# Patient Record
Sex: Female | Born: 2000 | Race: White | Hispanic: No | Marital: Single | Smoking: Never smoker
Health system: Southern US, Community
[De-identification: ages and names within clinical notes are randomized; demographics above are authoritative.]

## PROBLEM LIST (undated history)

## (undated) DIAGNOSIS — R1013 Epigastric pain: Secondary | ICD-10-CM

## (undated) DIAGNOSIS — F32A Depression, unspecified: Secondary | ICD-10-CM

## (undated) DIAGNOSIS — F419 Anxiety disorder, unspecified: Secondary | ICD-10-CM

## (undated) DIAGNOSIS — K219 Gastro-esophageal reflux disease without esophagitis: Secondary | ICD-10-CM

## (undated) DIAGNOSIS — K5900 Constipation, unspecified: Secondary | ICD-10-CM

## (undated) DIAGNOSIS — K589 Irritable bowel syndrome without diarrhea: Secondary | ICD-10-CM

## (undated) DIAGNOSIS — R109 Unspecified abdominal pain: Secondary | ICD-10-CM

## (undated) DIAGNOSIS — IMO0001 Reserved for inherently not codable concepts without codable children: Secondary | ICD-10-CM

## (undated) HISTORY — DX: Reserved for inherently not codable concepts without codable children: IMO0001

## (undated) HISTORY — DX: Unspecified abdominal pain: R10.9

## (undated) HISTORY — DX: Anxiety disorder, unspecified: F41.9

## (undated) HISTORY — DX: Irritable bowel syndrome, unspecified: K58.9

## (undated) HISTORY — PX: WISDOM TOOTH EXTRACTION: SHX21

## (undated) HISTORY — DX: Constipation, unspecified: K59.00

## (undated) HISTORY — DX: Gastro-esophageal reflux disease without esophagitis: K21.9

---
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
## 2021-01-27 ENCOUNTER — Ambulatory Visit: Payer: BC Managed Care – PPO | Admitting: Internal Medicine

## 2021-01-27 ENCOUNTER — Other Ambulatory Visit: Payer: Self-pay

## 2021-01-27 ENCOUNTER — Ambulatory Visit (INDEPENDENT_AMBULATORY_CARE_PROVIDER_SITE_OTHER): Payer: 59 | Admitting: Internal Medicine

## 2021-01-27 ENCOUNTER — Encounter: Payer: Self-pay | Admitting: Internal Medicine

## 2021-01-27 DIAGNOSIS — K581 Irritable bowel syndrome with constipation: Secondary | ICD-10-CM | POA: Diagnosis not present

## 2021-01-27 DIAGNOSIS — F419 Anxiety disorder, unspecified: Secondary | ICD-10-CM | POA: Diagnosis not present

## 2021-01-27 DIAGNOSIS — K5909 Other constipation: Secondary | ICD-10-CM

## 2021-01-27 NOTE — Progress Notes (Signed)
Patient ID: Diana [name], female   DOB: 08/18/2000, 20 y.o.   MRN: [id number]   Subjective:    Patient ID: Diana [name], female    DOB: January 09, 2001, 20 y.o.   MRN: [id number]  This visit occurred during the SARS-CoV-2 public health emergency.  Safety protocols were in place, including screening questions prior to the visit, additional usage of staff PPE, and extensive cleaning of exam room while observing appropriate contact time as indicated for disinfecting solutions.   Patient here to establish care.   Chief Complaint  Patient presents with   Establish Care   .   HPI Previously followed by pediatrics.  Here to establish care. She is [name] relatively well.  Freshman at [employer].  States - up to date with immunizations.  Has been healthy.  Saw GI [facility]).  Was evaluated for chronic abdominal pain and alternating constipation and diarrhea.  Reports occasional nausea.  She follows a gluten free diet.  Avoid dairy.  Symptoms felt to be c/w IBS - constipation predominant.  Prescribed Linzess.  Has had issues with anxiety and depression.  Has previously seen a counselor - Oscar La.  On wellbutrin.  [name] well with this medication.  Breathing stable.  No increased cough or congestion.     Past Medical History:  Diagnosis Date   Abdominal pain    Anxiety    Phreesia 10/17/2019   Constipation    IBS (irritable bowel syndrome)    Reflux    Viral meningitis 2002/01/30   Past Surgical History:  Procedure Laterality Date   WISDOM TOOTH EXTRACTION     Family History  Problem Relation Age of Onset   Arthritis Brother    Asthma Brother    Rheum arthritis Brother    Endometrial cancer Mother    Anxiety disorder Mother    Hearing loss Father    Hypertension Father    Kidney Stones Father    Breast cancer Maternal Grandmother    Alzheimer's disease Maternal Grandmother    Hypertension Maternal Grandmother    Early death Maternal Grandfather    Macular degeneration  Paternal Grandmother    Hypertension Paternal Grandmother    Arthritis Paternal Grandmother    Kidney Stones Paternal Grandmother    Heart disease Paternal Grandfather    Kidney disease Paternal Grandfather    Hirschsprung's disease Neg Hx    GER disease Neg Hx    Social History   Socioeconomic History   Marital status: Single    Spouse name: Not on file   Number of children: Not on file   Years of education: Not on file   Highest education level: Not on file  Occupational History   Not on file  Tobacco Use   Smoking status: Never   Smokeless tobacco: Never  Substance and Sexual Activity   Alcohol use: Never   Drug use: Never   Sexual activity: Never  Other Topics Concern   Not on file  Social History Narrative   Lives with with mom, dad, and her brother Ree Kida   She will start 12th grade in the fall at [employer].    Social Determinants of Health   Financial Resource Strain: Not on file  Food Insecurity: Not on file  Transportation Needs: Not on file  Physical Activity: Not on file  Stress: Not on file  Social Connections: Not on file     Review of Systems  Constitutional:  Negative for appetite change and unexpected weight change.  HENT:  Negative for congestion and sinus pressure.   Respiratory:  Negative for cough, chest tightness and shortness of breath.   Cardiovascular:  Negative for chest pain, palpitations and leg swelling.  Gastrointestinal:  Negative for vomiting.       Some nausea and bowel change as outlined.    Genitourinary:  Negative for difficulty urinating and dysuria.  Musculoskeletal:  Negative for joint swelling and myalgias.  Skin:  Negative for color change and rash.  Neurological:  Negative for dizziness, light-headedness and headaches.  Psychiatric/Behavioral:  Negative for agitation and dysphoric mood.       Objective:     BP 116/70   Pulse 88   Temp 97.8 F (36.6 C)   Resp 16   Ht 5\' 4"  (1.626 m)   Wt 192 lb  3.2 oz (87.2 kg)   SpO2 99%   BMI 32.99 kg/m  Wt Readings from Last 3 Encounters:  01/27/21 192 lb 3.2 oz (87.2 kg) (97 %, Z= 1.83)*  10/19/19 202 lb 6.4 oz (91.8 kg) (98 %, Z= 2.01)*  04/13/19 205 lb (93 kg) (98 %, Z= 2.06)*   * Growth percentiles are based on CDC (Girls, 2-20 Years) data.    Physical Exam Vitals reviewed.  Constitutional:      General: She is not in acute distress.    Appearance: Normal appearance.  HENT:     Head: Normocephalic and atraumatic.     Right Ear: External ear normal.     Left Ear: External ear normal.  Eyes:     General: No scleral icterus.       Right eye: No discharge.        Left eye: No discharge.     Conjunctiva/sclera: Conjunctivae normal.  Neck:     Thyroid: No thyromegaly.  Cardiovascular:     Rate and Rhythm: Normal rate and regular rhythm.  Pulmonary:     Effort: No respiratory distress.     Breath sounds: Normal breath sounds. No wheezing.  Abdominal:     General: Bowel sounds are normal.     Palpations: Abdomen is soft.     Tenderness: There is no abdominal tenderness.  Musculoskeletal:        General: No swelling or tenderness.     Cervical back: Neck supple. No tenderness.  Lymphadenopathy:     Cervical: No cervical adenopathy.  Skin:    Findings: No erythema or rash.  Neurological:     Mental Status: She is alert.  Psychiatric:        Mood and Affect: Mood normal.        Behavior: Behavior normal.     Outpatient Encounter Medications as of 01/27/2021  Medication Sig   buPROPion (WELLBUTRIN XL) 300 MG 24 hr tablet Take 300 mg by mouth daily.   linaclotide (LINZESS) 72 MCG capsule Take by mouth.   Biotin 5000 MCG TABS Take by mouth.   Calcium Polycarbophil (FIBER) 625 MG TABS Take by mouth.   Cholecalciferol (VITAMIN D3) 25 MCG (1000 UT) CAPS Take by mouth.   [DISCONTINUED] acetaminophen (TYLENOL) 500 MG tablet Take 500-1,000 mg by mouth every 6 (six) hours as needed for mild pain or fever. (Patient not taking:  Reported on 10/19/2019)   [DISCONTINUED] albuterol (VENTOLIN HFA) 108 (90 Base) MCG/ACT inhaler Inhale 2 puffs into the lungs every 6 (six) hours as needed for wheezing or shortness of breath. (Patient not taking: Reported on 10/19/2019)   [DISCONTINUED] buPROPion (WELLBUTRIN XL) 150 MG 24 hr tablet Take  300 mg by mouth daily.    [DISCONTINUED] ondansetron (ZOFRAN ODT) 4 MG disintegrating tablet Take 1 tablet (4 mg total) by mouth every 8 (eight) hours as needed for nausea or vomiting. (Patient not taking: Reported on 10/19/2019)   [DISCONTINUED] Spacer/Aero-Hold Chamber Bags (INSPIREASE RESERVOIR BAGS) MISC Use the inspiratory use or other spacer with the inhaler. (Patient not taking: Reported on 10/19/2019)   No facility-administered encounter medications on file as of 01/27/2021.     Lab Results  Component Value Date   WBC 3.1 (L) 04/13/2019   HGB 14.0 04/13/2019   HCT 40.3 04/13/2019   PLT 223 04/13/2019   GLUCOSE 116 (H) 04/13/2019   ALT 14 04/13/2019   AST 17 04/13/2019   NA 139 04/13/2019   K 3.9 04/13/2019   CL 104 04/13/2019   CREATININE 0.77 04/13/2019   BUN 8 04/13/2019   CO2 25 04/13/2019    DG Chest Portable 1 View  Result Date: 04/13/2019 CLINICAL DATA:  20 year old COVID-19 positive patient with fatigue, cough, chest congestion and shortness of breath. EXAM: PORTABLE CHEST 1 VIEW COMPARISON:  None. FINDINGS: Cardiac silhouette and mediastinal contours normal in appearance for the AP portable technique. Pulmonary parenchyma clear. Bronchovascular markings normal. Pulmonary vascularity normal. No pneumothorax. No visible pleural effusions. IMPRESSION: No acute cardiopulmonary disease. Electronically Signed   By: Hulan Saas M.D.   On: 04/13/2019 11:27       Assessment & Plan:   Problem List Items Addressed This Visit     Anxiety    Previously saw a counselor as outlined.  On wellbutrin.  Stable.  Follow.        Relevant Medications   buPROPion (WELLBUTRIN XL) 300  MG 24 hr tablet   Chronic constipation    Saw GI as outlined.  Linzess prescribed.  Follow.       IBS (irritable bowel syndrome)    Recently saw GI.  Symptoms felt to be c/w IBS - constipation predominant.  Prescribed Linzess.  Just started.  Follow symptoms.  Continue f/u with GI.        Relevant Medications   linaclotide (LINZESS) 72 MCG capsule     Dale [place], MD

## 2021-02-01 ENCOUNTER — Encounter: Payer: Self-pay | Admitting: Internal Medicine

## 2021-02-01 DIAGNOSIS — K589 Irritable bowel syndrome without diarrhea: Secondary | ICD-10-CM | POA: Insufficient documentation

## 2021-02-01 DIAGNOSIS — F419 Anxiety disorder, unspecified: Secondary | ICD-10-CM | POA: Insufficient documentation

## 2021-02-01 NOTE — Assessment & Plan Note (Signed)
Previously saw a Veterinary surgeon as outlined.  On wellbutrin.  Stable.  Follow.

## 2021-02-01 NOTE — Assessment & Plan Note (Signed)
Recently saw GI.  Symptoms felt to be c/w IBS - constipation predominant.  Prescribed Linzess.  Just started.  Follow symptoms.  Continue f/u with GI.

## 2021-02-01 NOTE — Assessment & Plan Note (Signed)
Saw GI as outlined.  Linzess prescribed.  Follow.

## 2021-04-21 DIAGNOSIS — D2262 Melanocytic nevi of left upper limb, including shoulder: Secondary | ICD-10-CM | POA: Diagnosis not present

## 2021-04-21 DIAGNOSIS — D2272 Melanocytic nevi of left lower limb, including hip: Secondary | ICD-10-CM | POA: Diagnosis not present

## 2021-04-21 DIAGNOSIS — D2271 Melanocytic nevi of right lower limb, including hip: Secondary | ICD-10-CM | POA: Diagnosis not present

## 2021-04-21 DIAGNOSIS — D2261 Melanocytic nevi of right upper limb, including shoulder: Secondary | ICD-10-CM | POA: Diagnosis not present

## 2021-04-21 DIAGNOSIS — D225 Melanocytic nevi of trunk: Secondary | ICD-10-CM | POA: Diagnosis not present

## 2021-04-21 DIAGNOSIS — L2089 Other atopic dermatitis: Secondary | ICD-10-CM | POA: Diagnosis not present

## 2021-04-30 IMAGING — DX DG CHEST 1V PORT
1 series · 1 of 1 positions shown · non-contrast
Comparison: None.

CLINICAL DATA: 18-year-old MASTZ-HV positive patient with fatigue,
cough, chest congestion and shortness of breath.

EXAM:
PORTABLE CHEST 1 VIEW

[chest ap]
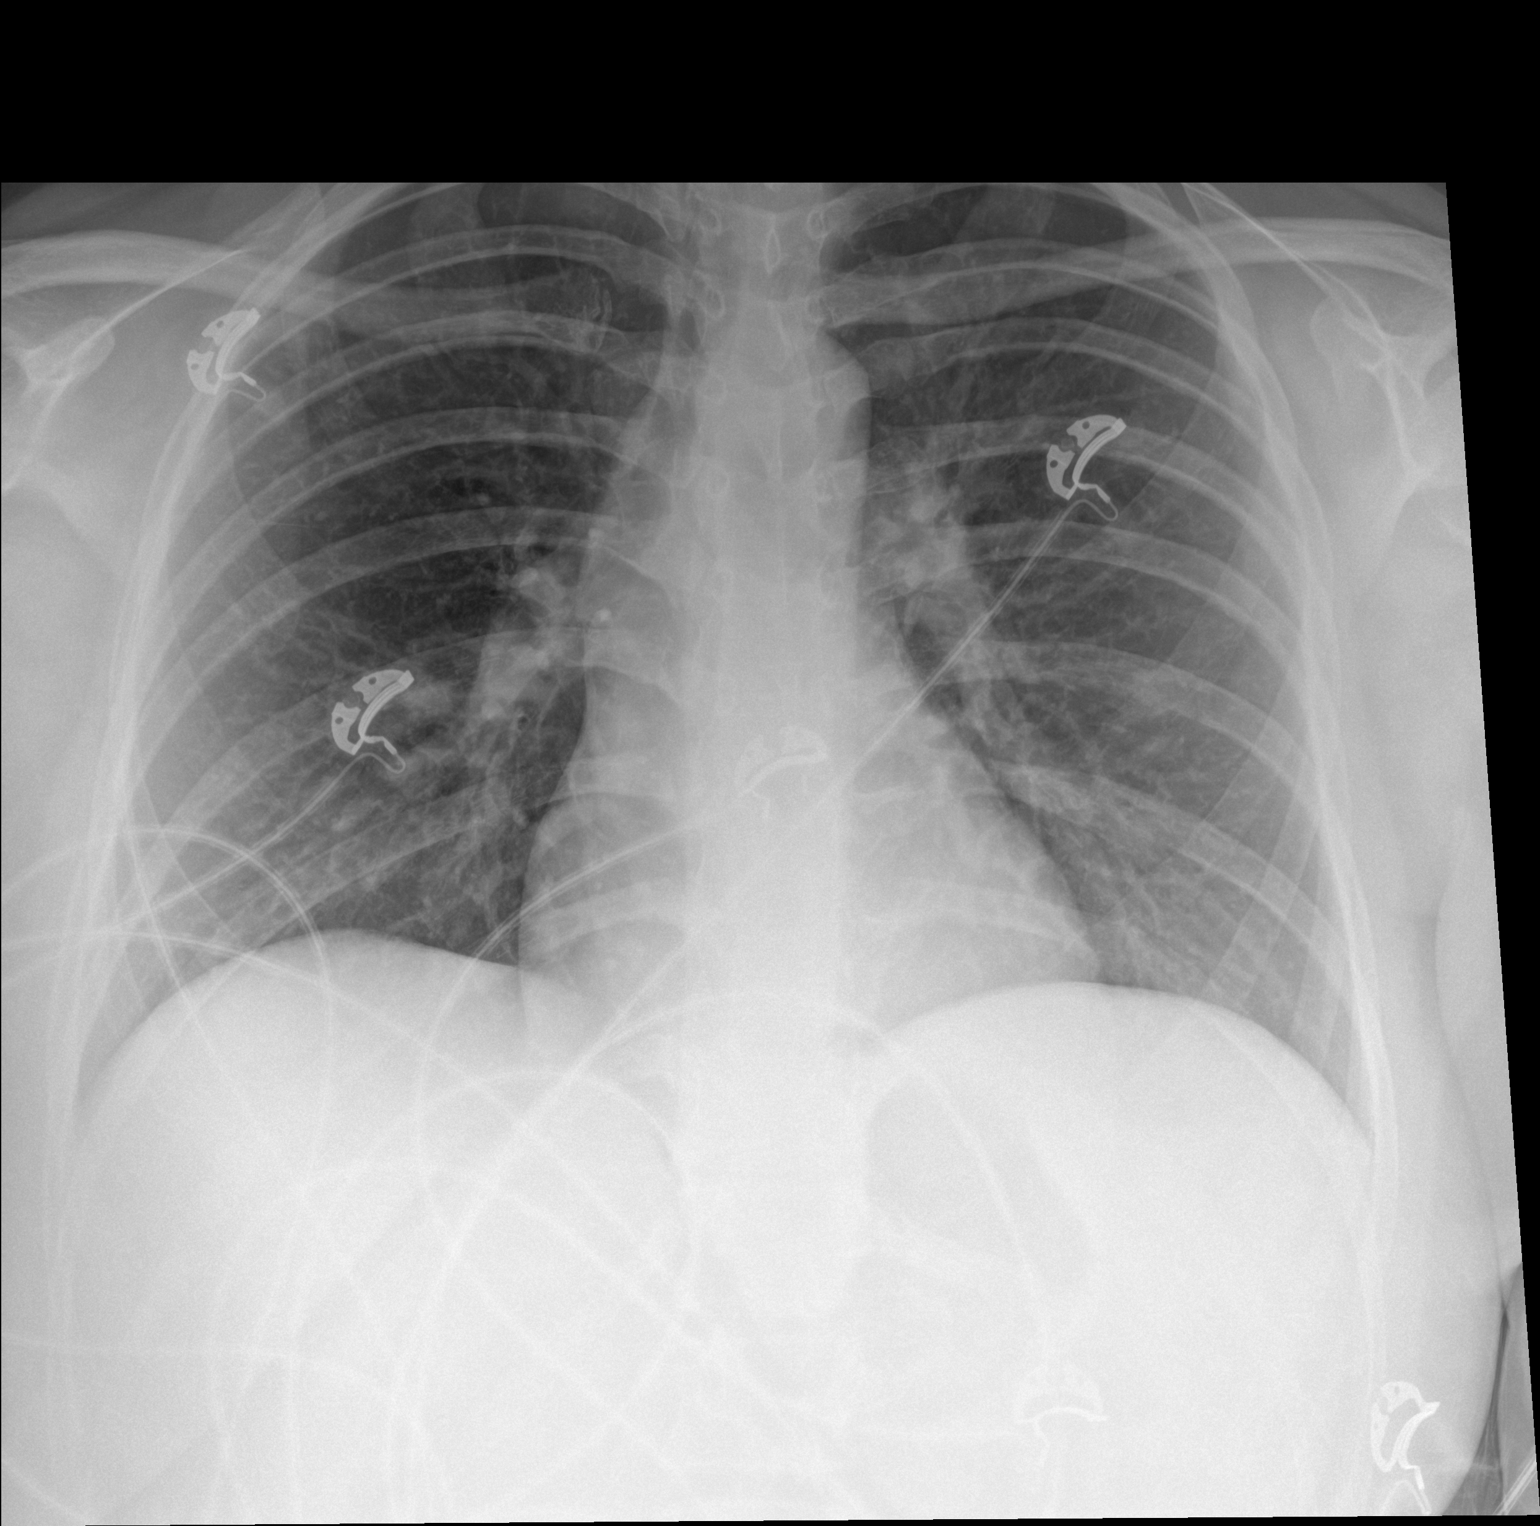

[1 of 1 positions shown; findings below may reference images not displayed]

FINDINGS: Cardiac silhouette and mediastinal contours normal in appearance for
the AP portable technique. Pulmonary parenchyma clear.
Bronchovascular markings normal. Pulmonary vascularity normal. No
pneumothorax. No visible pleural effusions.
IMPRESSION: No acute cardiopulmonary disease.

## 2021-05-30 ENCOUNTER — Other Ambulatory Visit: Payer: Self-pay

## 2021-05-30 ENCOUNTER — Encounter: Payer: Self-pay | Admitting: Internal Medicine

## 2021-05-30 ENCOUNTER — Ambulatory Visit (INDEPENDENT_AMBULATORY_CARE_PROVIDER_SITE_OTHER): Payer: 59 | Admitting: Internal Medicine

## 2021-05-30 DIAGNOSIS — K581 Irritable bowel syndrome with constipation: Secondary | ICD-10-CM

## 2021-05-30 DIAGNOSIS — K5909 Other constipation: Secondary | ICD-10-CM

## 2021-05-30 DIAGNOSIS — F419 Anxiety disorder, unspecified: Secondary | ICD-10-CM

## 2021-05-30 MED ORDER — BUSPIRONE HCL 5 MG PO TABS: 5.0000 mg | ORAL_TABLET | Freq: Two times a day (BID) | ORAL | 1 refills | Status: DC

## 2021-05-30 NOTE — Progress Notes (Signed)
Subjective:    Patient ID: Diana [name], female    DOB: [birth year], 21 y.o.   MRN: [id number]  This visit occurred during the SARS-CoV-2 public health emergency.  Safety protocols were in place, including screening questions prior to the visit, additional usage of staff PPE, and extensive cleaning of exam room while observing appropriate contact time as indicated for disinfecting solutions.   Patient here for a scheduled follow up.   Chief Complaint  Patient presents with   Follow-up    4 month follow up   .   HPI Here to follow up regarding increased stress.  Reports increased stress at college/classes.  Had a busy week this past week.  Taking wellbutrin.  Does feel helps.  Feels needs something more to help with controlling increased anxiety.  Still with some bowel issues.  On Linzess.  Diagnosed with IBS-C.  Has f/u next month with GI.  No chest pain or sob reported.  No cough or congestion.  Does report when anxious, some nausea associated.  Eating breakfast - helps.     Past Medical History:  Diagnosis Date   Abdominal pain    Anxiety    Phreesia 10/17/2019   Constipation    IBS (irritable bowel syndrome)    Reflux    Viral meningitis 01/10/2002   Past Surgical History:  Procedure Laterality Date   WISDOM TOOTH EXTRACTION     Family History  Problem Relation Age of Onset   Arthritis Brother    Asthma Brother    Rheum arthritis Brother    Endometrial cancer Mother    Anxiety disorder Mother    Hearing loss Father    Hypertension Father    Kidney Stones Father    Breast cancer Maternal Grandmother    Alzheimer's disease Maternal Grandmother    Hypertension Maternal Grandmother    Early death Maternal Grandfather    Macular degeneration Paternal Grandmother    Hypertension Paternal Grandmother    Arthritis Paternal Grandmother    Kidney Stones Paternal Grandmother    Heart disease Paternal Grandfather    Kidney disease Paternal Grandfather    Hirschsprung's  disease Neg Hx    GER disease Neg Hx    Social History   Socioeconomic History   Marital status: Single    Spouse name: Not on file   Number of children: Not on file   Years of education: Not on file   Highest education level: Not on file  Occupational History   Not on file  Tobacco Use   Smoking status: Never   Smokeless tobacco: Never  Substance and Sexual Activity   Alcohol use: Never   Drug use: Never   Sexual activity: Never  Other Topics Concern   Not on file  Social History Narrative   Lives with with mom, dad, and her brother Ree Kida   She will start 12th grade in the fall at [employer].    Social Determinants of Health   Financial Resource Strain: Not on file  Food Insecurity: Not on file  Transportation Needs: Not on file  Physical Activity: Not on file  Stress: Not on file  Social Connections: Not on file     Review of Systems  Constitutional:  Negative for appetite change and unexpected weight change.  HENT:  Negative for congestion and sinus pressure.   Respiratory:  Negative for cough, chest tightness and shortness of breath.   Cardiovascular:  Negative for chest pain, palpitations and leg swelling.  Gastrointestinal:  Positive for constipation and nausea. Negative for abdominal pain, diarrhea and vomiting.  Genitourinary:  Negative for difficulty urinating and dysuria.  Musculoskeletal:  Negative for joint swelling and myalgias.  Skin:  Negative for color change and rash.  Neurological:  Negative for dizziness, light-headedness and headaches.  Psychiatric/Behavioral:  Negative for agitation and dysphoric mood.       Objective:     BP 110/78    Pulse 93    Temp 97.7 F (36.5 C) (Oral)    Resp 16    Ht 5\' 4"  (1.626 m)    Wt 204 lb 12.8 oz (92.9 kg)    SpO2 97%    BMI 35.15 kg/m  Wt Readings from Last 3 Encounters:  05/30/21 204 lb 12.8 oz (92.9 kg) (98 %, Z= 2.01)*  01/27/21 192 lb 3.2 oz (87.2 kg) (97 %, Z= 1.83)*  10/19/19 202  lb 6.4 oz (91.8 kg) (98 %, Z= 2.01)*   * Growth percentiles are based on CDC (Girls, 2-20 Years) data.    Physical Exam Vitals reviewed.  Constitutional:      General: She is not in acute distress.    Appearance: Normal appearance.  HENT:     Head: Normocephalic and atraumatic.     Right Ear: External ear normal.     Left Ear: External ear normal.  Eyes:     General: No scleral icterus.       Right eye: No discharge.        Left eye: No discharge.     Conjunctiva/sclera: Conjunctivae normal.  Neck:     Thyroid: No thyromegaly.  Cardiovascular:     Rate and Rhythm: Normal rate and regular rhythm.  Pulmonary:     Effort: No respiratory distress.     Breath sounds: Normal breath sounds. No wheezing.  Abdominal:     General: Bowel sounds are normal.     Palpations: Abdomen is soft.     Tenderness: There is no abdominal tenderness.  Musculoskeletal:        General: No swelling or tenderness.     Cervical back: Neck supple. No tenderness.  Lymphadenopathy:     Cervical: No cervical adenopathy.  Skin:    Findings: No erythema or rash.  Neurological:     Mental Status: She is alert.  Psychiatric:        Mood and Affect: Mood normal.        Behavior: Behavior normal.     Outpatient Encounter Medications as of 05/30/2021  Medication Sig   Biotin 5000 MCG TABS Take by mouth.   buPROPion (WELLBUTRIN XL) 300 MG 24 hr tablet Take 300 mg by mouth daily.   busPIRone (BUSPAR) 5 MG tablet Take 1 tablet (5 mg total) by mouth 2 (two) times daily.   Calcium Polycarbophil (FIBER) 625 MG TABS Take by mouth.   Cholecalciferol (VITAMIN D3) 25 MCG (1000 UT) CAPS Take by mouth.   linaclotide (LINZESS) 72 MCG capsule Take by mouth.   triamcinolone cream (KENALOG) 0.1 % Apply topically 2 (two) times daily as needed.   No facility-administered encounter medications on file as of 05/30/2021.     Lab Results  Component Value Date   WBC 3.1 (L) 04/13/2019   HGB 14.0 04/13/2019   HCT 40.3  04/13/2019   PLT 223 04/13/2019   GLUCOSE 116 (H) 04/13/2019   ALT 14 04/13/2019   AST 17 04/13/2019   NA 139 04/13/2019   K 3.9 04/13/2019   CL 104 04/13/2019  CREATININE 0.77 04/13/2019   BUN 8 04/13/2019   CO2 25 04/13/2019    DG Chest Portable 1 View  Result Date: 04/13/2019 CLINICAL DATA:  21 year old COVID-19 positive patient with fatigue, cough, chest congestion and shortness of breath. EXAM: PORTABLE CHEST 1 VIEW COMPARISON:  None. FINDINGS: Cardiac silhouette and mediastinal contours normal in appearance for the AP portable technique. Pulmonary parenchyma clear. Bronchovascular markings normal. Pulmonary vascularity normal. No pneumothorax. No visible pleural effusions. IMPRESSION: No acute cardiopulmonary disease. Electronically Signed   By: Hulan Saas M.D.   On: 04/13/2019 11:27       Assessment & Plan:   Problem List Items Addressed This Visit     Anxiety    Previously saw a counselor.  Does not feel needs to see counselor at this time.  On wellbutrin.  Feels needs something more to help control anxiety.  Discussed treatment options.  Start buspar 5mg  bid.  Follow.        Relevant Medications   busPIRone (BUSPAR) 5 MG tablet   Chronic constipation    Saw GI.  On Linzess.  Has f/u with GI next month.       IBS (irritable bowel syndrome)    Saw GI.  Symptoms felt to be c/w IBS - constipation predominant.  On Linzess.  Continue f/u with GI.  Has appt next month.  Treat anxiety.         , MD

## 2021-05-31 ENCOUNTER — Encounter: Payer: Self-pay | Admitting: Internal Medicine

## 2021-05-31 NOTE — Assessment & Plan Note (Signed)
Saw GI.  On Linzess.  Has f/u with GI next month.

## 2021-05-31 NOTE — Assessment & Plan Note (Signed)
Saw GI.  Symptoms felt to be c/w IBS - constipation predominant.  On Linzess.  Continue f/u with GI.  Has appt next month.  Treat anxiety.

## 2021-05-31 NOTE — Assessment & Plan Note (Signed)
Previously saw a Veterinary surgeon.  Does not feel needs to see counselor at this time.  On wellbutrin.  Feels needs something more to help control anxiety.  Discussed treatment options.  Start buspar 5mg  bid.  Follow.

## 2021-07-03 DIAGNOSIS — K582 Mixed irritable bowel syndrome: Secondary | ICD-10-CM | POA: Diagnosis not present

## 2021-07-03 DIAGNOSIS — K219 Gastro-esophageal reflux disease without esophagitis: Secondary | ICD-10-CM | POA: Diagnosis not present

## 2021-07-03 DIAGNOSIS — K5909 Other constipation: Secondary | ICD-10-CM | POA: Diagnosis not present

## 2021-07-14 ENCOUNTER — Telehealth: Payer: Self-pay | Admitting: Internal Medicine

## 2021-07-14 NOTE — Telephone Encounter (Signed)
Pt mom called in stating pt need refill on buPROPion sent to [facility] in [place] ?

## 2021-07-15 ENCOUNTER — Other Ambulatory Visit: Payer: Self-pay

## 2021-07-15 MED ORDER — BUPROPION HCL ER (XL) 300 MG PO TB24: 300.0000 mg | ORAL_TABLET | Freq: Every day | ORAL | 2 refills | Status: DC

## 2021-07-15 NOTE — Telephone Encounter (Signed)
sent 

## 2021-07-28 ENCOUNTER — Other Ambulatory Visit: Payer: Self-pay | Admitting: Internal Medicine

## 2021-08-01 ENCOUNTER — Ambulatory Visit (INDEPENDENT_AMBULATORY_CARE_PROVIDER_SITE_OTHER): Payer: 59 | Admitting: Internal Medicine

## 2021-08-01 ENCOUNTER — Encounter: Payer: Self-pay | Admitting: Internal Medicine

## 2021-08-01 DIAGNOSIS — K581 Irritable bowel syndrome with constipation: Secondary | ICD-10-CM

## 2021-08-01 DIAGNOSIS — F419 Anxiety disorder, unspecified: Secondary | ICD-10-CM

## 2021-08-01 DIAGNOSIS — R0981 Nasal congestion: Secondary | ICD-10-CM

## 2021-08-01 DIAGNOSIS — K5909 Other constipation: Secondary | ICD-10-CM

## 2021-08-01 DIAGNOSIS — R69 Illness, unspecified: Secondary | ICD-10-CM | POA: Diagnosis not present

## 2021-08-01 MED ORDER — MAGNESIUM GLYCINATE 100 MG PO CAPS: ORAL_CAPSULE | ORAL | 1 refills | Status: DC

## 2021-08-01 NOTE — Progress Notes (Signed)
Patient ID: Diana [name], female   DOB: [birth year], 21 y.o.   MRN: [id number] ? ? ?Subjective:  ? ? Patient ID: Diana DoingMeleah Sottile, female    DOB: [birth year], 21 y.o.   MRN: [id number] ? ?This visit occurred during the SARS-CoV-2 public health emergency.  Safety protocols were in place, including screening questions prior to the visit, additional usage of staff PPE, and extensive cleaning of exam room while observing appropriate contact time as indicated for disinfecting solutions.  ? ?Patient here for a scheduled follow up.  ? ?Chief Complaint  ?Patient presents with  ? Follow-up  ?  2 month follow up   ? .  ? ?HPI ?She is accompanied by her mother.  History obgtained from both of them. Here to follow up regarding increased stress and anxiety.  Also persistent GI issues.  Starting last week - sore throat and increased drainage.  Some increased congestion and cough.  Is feeling better, but still some congestion and drainage.  No chest pain or sob reported.  Increased stress and anxiety.  On wellbutrin.  Previously on zoloft, but gained weight on zoloft.  Taking buspar as well.  Feels needs something different.  Increased stress related to her medical issues - persistent problems with her bowels.  Increased constipation.  Some nausea associated.  Linzess worked well.  Insurance does not cover.  On amitiza now.  Does not help and does not tolerate.  Taking miralax.  Discussed the need to for f/u with GI.  Also with headaches. Occur several times per week.  Will occasionally take excedrin.  Headache may last 10 minutes if takes excedrin and 30 minutes if does not take anything.  Tries not to take excedrin regularly.  No suicidal ideations.   ? ? ?Past Medical History:  ?Diagnosis Date  ? Abdominal pain   ? Anxiety   ? Phreesia 10/17/2019  ? Constipation   ? IBS (irritable bowel syndrome)   ? Reflux   ? Viral meningitis 11/17/2001  ? ?Past Surgical History:  ?Procedure Laterality Date  ? WISDOM TOOTH EXTRACTION    ? ?Family  History  ?Problem Relation Age of Onset  ? Arthritis Brother   ? Asthma Brother   ? Rheum arthritis Brother   ? Endometrial cancer Mother   ? Anxiety disorder Mother   ? Hearing loss Father   ? Hypertension Father   ? Kidney Stones Father   ? Breast cancer Maternal Grandmother   ? Alzheimer's disease Maternal Grandmother   ? Hypertension Maternal Grandmother   ? Early death Maternal Grandfather   ? Macular degeneration Paternal Grandmother   ? Hypertension Paternal Grandmother   ? Arthritis Paternal Grandmother   ? Kidney Stones Paternal Grandmother   ? Heart disease Paternal Grandfather   ? Kidney disease Paternal Grandfather   ? Hirschsprung's disease Neg Hx   ? GER disease Neg Hx   ? ?Social History  ? ?Socioeconomic History  ? Marital status: Single  ?  Spouse name: Not on file  ? Number of children: Not on file  ? Years of education: Not on file  ? Highest education level: Not on file  ?Occupational History  ? Not on file  ?Tobacco Use  ? Smoking status: Never  ? Smokeless tobacco: Never  ?Substance and Sexual Activity  ? Alcohol use: Never  ? Drug use: Never  ? Sexual activity: Never  ?Other Topics Concern  ? Not on file  ?Social History Narrative  ? Lives with with mom, dad,  and her brother Ree Kida  ? She will start 12th grade in the fall at [employer].   ? ?Social Determinants of Health  ? ?Financial Resource Strain: Not on file  ?Food Insecurity: Not on file  ?Transportation Needs: Not on file  ?Physical Activity: Not on file  ?Stress: Not on file  ?Social Connections: Not on file  ? ? ? ?Review of Systems  ?Constitutional:  Negative for appetite change, fever and unexpected weight change.  ?HENT:  Positive for congestion and postnasal drip.   ?Respiratory:  Negative for chest tightness and shortness of breath.   ?     Some cough.   ?Cardiovascular:  Negative for chest pain, palpitations and leg swelling.  ?Gastrointestinal:  Positive for abdominal pain, constipation and nausea. Negative for  vomiting.  ?Genitourinary:  Negative for difficulty urinating and dysuria.  ?Musculoskeletal:  Negative for joint swelling and myalgias.  ?Skin:  Negative for color change and rash.  ?Neurological:  Positive for dizziness and headaches.  ?     Intermittent dizziness as outlined.   ?Psychiatric/Behavioral:  Negative for suicidal ideas.   ?     Increased stress and anxiety as outlined. Anxiety and stress worsened with current medical issues - worsening GI issues.  No suicidal ideations.   ? ?   ?Objective:  ?  ? ?BP 104/78 (BP Location: Left Arm, Patient Position: Sitting, Cuff Size: Large)   Pulse 79   Temp (!) 97 ?F (36.1 ?C) (Oral)   Ht 5\' 4"  (1.626 m)   Wt 197 lb 9.6 oz (89.6 kg)   SpO2 97%   BMI 33.92 kg/m?  ?Wt Readings from Last 3 Encounters:  ?08/01/21 197 lb 9.6 oz (89.6 kg) (97 %, Z= 1.91)*  ?05/30/21 204 lb 12.8 oz (92.9 kg) (98 %, Z= 2.01)*  ?01/27/21 192 lb 3.2 oz (87.2 kg) (97 %, Z= 1.83)*  ? ?* Growth percentiles are based on CDC (Girls, 2-20 Years) data.  ? ? ?Physical Exam ?Vitals reviewed.  ?Constitutional:   ?   General: She is not in acute distress. ?   Appearance: Normal appearance.  ?HENT:  ?   Head: Normocephalic and atraumatic.  ?   Right Ear: External ear normal.  ?   Left Ear: External ear normal.  ?Eyes:  ?   General: No scleral icterus.    ?   Right eye: No discharge.     ?   Left eye: No discharge.  ?   Conjunctiva/sclera: Conjunctivae normal.  ?Neck:  ?   Thyroid: No thyromegaly.  ?Cardiovascular:  ?   Rate and Rhythm: Normal rate and regular rhythm.  ?Pulmonary:  ?   Effort: No respiratory distress.  ?   Breath sounds: Normal breath sounds. No wheezing.  ?Abdominal:  ?   General: Bowel sounds are normal.  ?   Palpations: Abdomen is soft.  ?   Tenderness: There is no abdominal tenderness.  ?Musculoskeletal:     ?   General: No swelling or tenderness.  ?   Cervical back: Neck supple. No tenderness.  ?Lymphadenopathy:  ?   Cervical: No cervical adenopathy.  ?Skin: ?   Findings: No  erythema or rash.  ?Neurological:  ?   Mental Status: She is alert.  ?Psychiatric:     ?   Mood and Affect: Mood normal.     ?   Behavior: Behavior normal.  ? ? ? ?Outpatient Encounter Medications as of 08/01/2021  ?Medication Sig  ? Biotin 5000 MCG  TABS Take by mouth.  ? buPROPion (WELLBUTRIN XL) 300 MG 24 hr tablet Take 1 tablet (300 mg total) by mouth daily.  ? busPIRone (BUSPAR) 5 MG tablet TAKE 1 TABLET BY MOUTH TWICE A DAY  ? Calcium Polycarbophil (FIBER) 625 MG TABS Take by mouth.  ? Cholecalciferol (VITAMIN D3) 25 MCG (1000 UT) CAPS Take by mouth.  ? lubiprostone (AMITIZA) 8 MCG capsule Take 8 mcg by mouth 2 (two) times daily.  ? Magnesium Glycinate 100 MG CAPS On capsule q day  ? triamcinolone cream (KENALOG) 0.1 % Apply topically 2 (two) times daily as needed.  ? [DISCONTINUED] linaclotide (LINZESS) 72 MCG capsule Take by mouth. (Patient not taking: Reported on 08/01/2021)  ? ?No facility-administered encounter medications on file as of 08/01/2021.  ?  ? ?Lab Results  ?Component Value Date  ? WBC 3.1 (L) 04/13/2019  ? HGB 14.0 04/13/2019  ? HCT 40.3 04/13/2019  ? PLT 223 04/13/2019  ? GLUCOSE 116 (H) 04/13/2019  ? ALT 14 04/13/2019  ? AST 17 04/13/2019  ? NA 139 04/13/2019  ? K 3.9 04/13/2019  ? CL 104 04/13/2019  ? CREATININE 0.77 04/13/2019  ? BUN 8 04/13/2019  ? CO2 25 04/13/2019  ? ? ?DG Chest Portable 1 View ? ?Result Date: 04/13/2019 ?CLINICAL DATA:  21 year old COVID-19 positive patient with fatigue, cough, chest congestion and shortness of breath. EXAM: PORTABLE CHEST 1 VIEW COMPARISON:  None. FINDINGS: Cardiac silhouette and mediastinal contours normal in appearance for the AP portable technique. Pulmonary parenchyma clear. Bronchovascular markings normal. Pulmonary vascularity normal. No pneumothorax. No visible pleural effusions. IMPRESSION: No acute cardiopulmonary disease. Electronically Signed   By: Hulan Saas M.D.   On: 04/13/2019 11:27  ? ? ?   ?Assessment & Plan:  ? ?Problem List Items  Addressed This Visit   ? ? Anxiety  ?  Increased stress and anxiety as outlined.  Worsened recently.  On wellbutrin.  Taking buspar as well.  Previously was on zoloft.  Increased weight gain.  Did not feel work

## 2021-08-03 ENCOUNTER — Encounter: Payer: Self-pay | Admitting: Internal Medicine

## 2021-08-03 DIAGNOSIS — R0981 Nasal congestion: Secondary | ICD-10-CM | POA: Insufficient documentation

## 2021-08-03 NOTE — Assessment & Plan Note (Signed)
Increased stress and anxiety as outlined.  Worsened recently.  On wellbutrin.  Taking buspar as well.  Previously was on zoloft.  Increased weight gain.  Did not feel worked any better than wellbutrin.  Discussed medication changes.  Discussed psychiatry referral.  She is agreeable.  Discussed with psychiatry regarding medication changes.  Follow.  No SI.   ?

## 2021-08-03 NOTE — Assessment & Plan Note (Signed)
Is followed by GI.  linzess worked well, but insurance would not cover.  On amitiza.  Did not tolerate and does not work.  Increased constipation and discomfort.  Discussed the need for f/u with GI. Plans to contact and f/u.  Discussed possible need for EGD/colonoscopy.  miralax daily.  Follow. ?

## 2021-08-03 NOTE — Assessment & Plan Note (Addendum)
Is followed by GI.  linzess worked well, but insurance would not cover.  On amitiza.  Did not tolerate and does not work.  Increased constipation and discomfort.  Discussed the need for f/u with GI. Plans to contact and f/u.  Discussed possible need for EGD/colonoscopy.  miralax daily.  Follow.  D/w pharmacy regarding any pt assistance for Linzess.  ?

## 2021-08-03 NOTE — Assessment & Plan Note (Signed)
Symptoms over one week.  Better.  Persistent nasal congestion and drainage.  Discussed continuing mucinex.  Saline nasal nasal spray.  Improved.  Follow. Notify me if symptoms do not resolve.  ?

## 2021-08-19 ENCOUNTER — Telehealth: Payer: Self-pay | Admitting: Internal Medicine

## 2021-08-19 DIAGNOSIS — F419 Anxiety disorder, unspecified: Secondary | ICD-10-CM

## 2021-08-19 NOTE — Telephone Encounter (Signed)
The patient's  mother wanted to inquire about the patient's referral to Dr. Nicolasa Ducking to adjust the patient's medication. She also is needing help with options to purchase Linzess and a lower cost.  ?

## 2021-08-20 NOTE — Telephone Encounter (Signed)
Notify - I have talked with Dr Maryruth Bun.  I have placed the order for the referral.  Also, she had mentioned possibly starting effexor.  Is she agreeable to start/add medication.   ?

## 2021-08-20 NOTE — Telephone Encounter (Signed)
Lm for Joy to cb ?

## 2021-08-27 NOTE — Telephone Encounter (Signed)
Gunnar Fusi from Dr Maryruth Bun office states she can not get in touch with pt to make an appt ?

## 2021-08-29 ENCOUNTER — Telehealth: Payer: Self-pay

## 2021-08-29 NOTE — Telephone Encounter (Signed)
Gunnar Fusi called from Dr. Armond Hang office.  Gunnar Fusi states that they have left at least six messages trying to get patient to schedule an appointment and they have not heard from patient. ?

## 2021-08-30 NOTE — Telephone Encounter (Signed)
Called and left message for Diana [name] (mother) to call back regarding Diana [name].  [name] had stated to contact mother with information.  Notify that I did talk to Dr Nicolasa Ducking and she was trying to get in touch to work her in and discuss changing medication.   ?

## 2021-09-02 ENCOUNTER — Other Ambulatory Visit: Payer: Self-pay | Admitting: Internal Medicine

## 2021-09-02 MED ORDER — LINACLOTIDE 145 MCG PO CAPS
145.0000 ug | ORAL_CAPSULE | Freq: Every day | ORAL | 1 refills | Status: DC
Start: 2021-09-02 — End: 2022-09-29

## 2021-09-02 NOTE — Progress Notes (Signed)
Discussed with pts mother.  GI had prescribed linzess q day. Not covered by her insurance.  Spoke with Wilkie Aye at [facility] pharmacy.  Rx sent to [facility].  Pt and mother aware.  ?

## 2021-09-02 NOTE — Telephone Encounter (Signed)
Patient's mother called to let Dr Nicki Reaper know that an appointment has been made with Dr Nicolasa Ducking. It is sometime in June, mother did not have calender to look up the date and time. ?

## 2021-09-11 DIAGNOSIS — K582 Mixed irritable bowel syndrome: Secondary | ICD-10-CM | POA: Diagnosis not present

## 2021-09-11 DIAGNOSIS — K219 Gastro-esophageal reflux disease without esophagitis: Secondary | ICD-10-CM | POA: Diagnosis not present

## 2021-09-11 DIAGNOSIS — K5909 Other constipation: Secondary | ICD-10-CM | POA: Diagnosis not present

## 2021-09-26 ENCOUNTER — Ambulatory Visit (INDEPENDENT_AMBULATORY_CARE_PROVIDER_SITE_OTHER): Payer: 59 | Admitting: Internal Medicine

## 2021-09-26 ENCOUNTER — Encounter: Payer: Self-pay | Admitting: Internal Medicine

## 2021-09-26 DIAGNOSIS — K5909 Other constipation: Secondary | ICD-10-CM

## 2021-09-26 DIAGNOSIS — F419 Anxiety disorder, unspecified: Secondary | ICD-10-CM | POA: Diagnosis not present

## 2021-09-26 DIAGNOSIS — R69 Illness, unspecified: Secondary | ICD-10-CM | POA: Diagnosis not present

## 2021-09-26 NOTE — Progress Notes (Unsigned)
Patient ID: Diana [name], female   DOB: Aug 21, 2000, 21 y.o.   MRN: [id number]   Subjective:    Patient ID: Diana [name], female    DOB: 2000-11-28, 21 y.o.   MRN: [id number]   Patient here for a scheduled follow up.   Chief Complaint  Patient presents with   Follow-up   Anxiety   .   HPI She is accompanied by her mother.  History obtained from both of them.  She is currently on wellbutrin and buspar.  Is dong some better - with being out of school.  Still with increased anxiety.  Has appt with Dr Maryruth Bun 10/07/21.  Discussed medication.  Discussed possibly changing to effexor.  Since appt soon with Dr Maryruth Bun, will remain on current medication regimen.  Main stress is with her bowels.  Seeing GI.  Insurance has authorized linzess, but still with high out of pocket cost.  In process of working with GI to see if can get covered.  Overall does appear to be [name] better.    Past Medical History:  Diagnosis Date   Abdominal pain    Anxiety    Phreesia 10/17/2019   Constipation    IBS (irritable bowel syndrome)    Reflux    Viral meningitis 2001/11/23   Past Surgical History:  Procedure Laterality Date   WISDOM TOOTH EXTRACTION     Family History  Problem Relation Age of Onset   Arthritis Brother    Asthma Brother    Rheum arthritis Brother    Endometrial cancer Mother    Anxiety disorder Mother    Hearing loss Father    Hypertension Father    Kidney Stones Father    Breast cancer Maternal Grandmother    Alzheimer's disease Maternal Grandmother    Hypertension Maternal Grandmother    Early death Maternal Grandfather    Macular degeneration Paternal Grandmother    Hypertension Paternal Grandmother    Arthritis Paternal Grandmother    Kidney Stones Paternal Grandmother    Heart disease Paternal Grandfather    Kidney disease Paternal Grandfather    Hirschsprung's disease Neg Hx    GER disease Neg Hx    Social History   Socioeconomic History   Marital status: Single     Spouse name: Not on file   Number of children: Not on file   Years of education: Not on file   Highest education level: Not on file  Occupational History   Not on file  Tobacco Use   Smoking status: Never   Smokeless tobacco: Never  Substance and Sexual Activity   Alcohol use: Never   Drug use: Never   Sexual activity: Never  Other Topics Concern   Not on file  Social History Narrative   Lives with with mom, dad, and her brother Ree Kida   She will start 12th grade in the fall at [employer].    Social Determinants of Health   Financial Resource Strain: Not on file  Food Insecurity: Not on file  Transportation Needs: Not on file  Physical Activity: Not on file  Stress: Not on file  Social Connections: Not on file     Review of Systems  Constitutional:  Negative for appetite change and unexpected weight change.  HENT:  Negative for congestion and sinus pressure.   Respiratory:  Negative for cough, chest tightness and shortness of breath.   Cardiovascular:  Negative for chest pain, palpitations and leg swelling.  Gastrointestinal:  Negative for abdominal pain, diarrhea, nausea  and vomiting.  Genitourinary:  Negative for difficulty urinating and dysuria.  Musculoskeletal:  Negative for joint swelling and myalgias.  Skin:  Negative for color change and rash.  Neurological:  Negative for dizziness, light-headedness and headaches.  Psychiatric/Behavioral:  Negative for agitation and dysphoric mood.        Increased stress and anxiety as outlined.        Objective:     BP 124/60 (BP Location: Left Arm, Patient Position: Sitting, Cuff Size: Small)   Pulse 83   Temp 98.7 F (37.1 C) (Temporal)   Resp 16   Ht 5\' 4"  (1.626 m)   Wt 201 lb 3.2 oz (91.3 kg)   SpO2 98%   BMI 34.54 kg/m  Wt Readings from Last 3 Encounters:  09/26/21 201 lb 3.2 oz (91.3 kg) (97 %, Z= 1.96)*  08/01/21 197 lb 9.6 oz (89.6 kg) (97 %, Z= 1.91)*  05/30/21 204 lb 12.8 oz (92.9 kg)  (98 %, Z= 2.01)*   * Growth percentiles are based on CDC (Girls, 2-20 Years) data.    Physical Exam Vitals reviewed.  Constitutional:      General: She is not in acute distress.    Appearance: Normal appearance.  HENT:     Head: Normocephalic and atraumatic.     Right Ear: External ear normal.     Left Ear: External ear normal.  Eyes:     General: No scleral icterus.       Right eye: No discharge.        Left eye: No discharge.     Conjunctiva/sclera: Conjunctivae normal.  Neck:     Thyroid: No thyromegaly.  Cardiovascular:     Rate and Rhythm: Normal rate and regular rhythm.  Pulmonary:     Effort: No respiratory distress.     Breath sounds: Normal breath sounds. No wheezing.  Abdominal:     General: Bowel sounds are normal.     Palpations: Abdomen is soft.     Tenderness: There is no abdominal tenderness.  Musculoskeletal:        General: No swelling or tenderness.     Cervical back: Neck supple. No tenderness.  Lymphadenopathy:     Cervical: No cervical adenopathy.  Skin:    Findings: No erythema or rash.  Neurological:     Mental Status: She is alert.  Psychiatric:        Mood and Affect: Mood normal.        Behavior: Behavior normal.      Outpatient Encounter Medications as of 09/26/2021  Medication Sig   Biotin 5000 MCG TABS Take by mouth.   buPROPion (WELLBUTRIN XL) 300 MG 24 hr tablet Take 1 tablet (300 mg total) by mouth daily.   busPIRone (BUSPAR) 5 MG tablet TAKE 1 TABLET BY MOUTH TWICE A DAY   Calcium Polycarbophil (FIBER) 625 MG TABS Take by mouth.   Cholecalciferol (VITAMIN D3) 25 MCG (1000 UT) CAPS Take by mouth.   Lactulose 20 GM/30ML SOLN    linaclotide (LINZESS) 145 MCG CAPS capsule Take 1 capsule (145 mcg total) by mouth daily before breakfast.   Magnesium Glycinate 100 MG CAPS On capsule q day   triamcinolone cream (KENALOG) 0.1 % Apply topically 2 (two) times daily as needed.   [DISCONTINUED] lubiprostone (AMITIZA) 8 MCG capsule Take 8 mcg  by mouth 2 (two) times daily.   No facility-administered encounter medications on file as of 09/26/2021.     Lab Results  Component Value Date  WBC 3.1 (L) 04/13/2019   HGB 14.0 04/13/2019   HCT 40.3 04/13/2019   PLT 223 04/13/2019   GLUCOSE 116 (H) 04/13/2019   ALT 14 04/13/2019   AST 17 04/13/2019   NA 139 04/13/2019   K 3.9 04/13/2019   CL 104 04/13/2019   CREATININE 0.77 04/13/2019   BUN 8 04/13/2019   CO2 25 04/13/2019    DG Chest Portable 1 View  Result Date: 04/13/2019 CLINICAL DATA:  21 year old COVID-19 positive patient with fatigue, cough, chest congestion and shortness of breath. EXAM: PORTABLE CHEST 1 VIEW COMPARISON:  None. FINDINGS: Cardiac silhouette and mediastinal contours normal in appearance for the AP portable technique. Pulmonary parenchyma clear. Bronchovascular markings normal. Pulmonary vascularity normal. No pneumothorax. No visible pleural effusions. IMPRESSION: No acute cardiopulmonary disease. Electronically Signed   By: Hulan Saashomas  Lawrence M.D.   On: 04/13/2019 11:27       Assessment & Plan:   Problem List Items Addressed This Visit     Anxiety    She is currently on wellbutrin and buspar.  Is dong some better - with being out of school.  Still with increased anxiety.  Has appt with Dr Maryruth BunKapur 10/07/21.  Discussed medication.  Discussed possibly changing to effexor.  Since appt soon with Dr Maryruth BunKapur, will remain on current medication regimen.       Chronic constipation    Saw GI.  Linzess works well.  Apparently has received authorization from the insurance, but is still high out of pocket cost.  Trying to work through assistance.  Continue f/u with GI.  Using lactulose currently.       Relevant Medications   Lactulose 20 GM/30ML SOLN     Dale Durhamharlene Mikeal Winstanley, MD

## 2021-09-28 ENCOUNTER — Encounter: Payer: Self-pay | Admitting: Internal Medicine

## 2021-09-28 NOTE — Assessment & Plan Note (Signed)
She is currently on wellbutrin and buspar.  Is dong some better - with being out of school.  Still with increased anxiety.  Has appt with Dr Nicolasa Ducking 10/07/21.  Discussed medication.  Discussed possibly changing to effexor.  Since appt soon with Dr Nicolasa Ducking, will remain on current medication regimen.

## 2021-09-28 NOTE — Assessment & Plan Note (Signed)
Saw GI.  Linzess works well.  Apparently has received authorization from the insurance, but is still high out of pocket cost.  Trying to work through assistance.  Continue f/u with GI.  Using lactulose currently.

## 2021-10-08 DIAGNOSIS — F325 Major depressive disorder, single episode, in full remission: Secondary | ICD-10-CM | POA: Diagnosis not present

## 2021-10-08 DIAGNOSIS — F429 Obsessive-compulsive disorder, unspecified: Secondary | ICD-10-CM | POA: Diagnosis not present

## 2021-10-08 DIAGNOSIS — R69 Illness, unspecified: Secondary | ICD-10-CM | POA: Diagnosis not present

## 2021-10-23 DIAGNOSIS — F325 Major depressive disorder, single episode, in full remission: Secondary | ICD-10-CM | POA: Diagnosis not present

## 2021-10-23 DIAGNOSIS — R69 Illness, unspecified: Secondary | ICD-10-CM | POA: Diagnosis not present

## 2021-10-23 DIAGNOSIS — F429 Obsessive-compulsive disorder, unspecified: Secondary | ICD-10-CM | POA: Diagnosis not present

## 2021-11-20 DIAGNOSIS — F429 Obsessive-compulsive disorder, unspecified: Secondary | ICD-10-CM | POA: Diagnosis not present

## 2021-11-20 DIAGNOSIS — F325 Major depressive disorder, single episode, in full remission: Secondary | ICD-10-CM | POA: Diagnosis not present

## 2021-11-20 DIAGNOSIS — R69 Illness, unspecified: Secondary | ICD-10-CM | POA: Diagnosis not present

## 2021-12-03 DIAGNOSIS — F429 Obsessive-compulsive disorder, unspecified: Secondary | ICD-10-CM | POA: Diagnosis not present

## 2021-12-03 DIAGNOSIS — R69 Illness, unspecified: Secondary | ICD-10-CM | POA: Diagnosis not present

## 2021-12-03 DIAGNOSIS — F325 Major depressive disorder, single episode, in full remission: Secondary | ICD-10-CM | POA: Diagnosis not present

## 2021-12-10 DIAGNOSIS — R69 Illness, unspecified: Secondary | ICD-10-CM | POA: Diagnosis not present

## 2021-12-24 DIAGNOSIS — R69 Illness, unspecified: Secondary | ICD-10-CM | POA: Diagnosis not present

## 2021-12-29 ENCOUNTER — Ambulatory Visit: Payer: 59 | Admitting: Internal Medicine

## 2022-01-05 DIAGNOSIS — F429 Obsessive-compulsive disorder, unspecified: Secondary | ICD-10-CM | POA: Diagnosis not present

## 2022-01-05 DIAGNOSIS — R69 Illness, unspecified: Secondary | ICD-10-CM | POA: Diagnosis not present

## 2022-01-05 DIAGNOSIS — F325 Major depressive disorder, single episode, in full remission: Secondary | ICD-10-CM | POA: Diagnosis not present

## 2022-01-07 DIAGNOSIS — R69 Illness, unspecified: Secondary | ICD-10-CM | POA: Diagnosis not present

## 2022-01-21 DIAGNOSIS — F429 Obsessive-compulsive disorder, unspecified: Secondary | ICD-10-CM | POA: Diagnosis not present

## 2022-01-26 DIAGNOSIS — F411 Generalized anxiety disorder: Secondary | ICD-10-CM | POA: Diagnosis not present

## 2022-01-26 DIAGNOSIS — F429 Obsessive-compulsive disorder, unspecified: Secondary | ICD-10-CM | POA: Diagnosis not present

## 2022-01-26 DIAGNOSIS — F325 Major depressive disorder, single episode, in full remission: Secondary | ICD-10-CM | POA: Diagnosis not present

## 2022-02-04 DIAGNOSIS — F429 Obsessive-compulsive disorder, unspecified: Secondary | ICD-10-CM | POA: Diagnosis not present

## 2022-02-18 DIAGNOSIS — F325 Major depressive disorder, single episode, in full remission: Secondary | ICD-10-CM | POA: Diagnosis not present

## 2022-02-18 DIAGNOSIS — F429 Obsessive-compulsive disorder, unspecified: Secondary | ICD-10-CM | POA: Diagnosis not present

## 2022-02-18 DIAGNOSIS — F411 Generalized anxiety disorder: Secondary | ICD-10-CM | POA: Diagnosis not present

## 2022-04-07 ENCOUNTER — Ambulatory Visit (INDEPENDENT_AMBULATORY_CARE_PROVIDER_SITE_OTHER): Payer: Self-pay | Admitting: Internal Medicine

## 2022-04-07 ENCOUNTER — Encounter: Payer: Self-pay | Admitting: Internal Medicine

## 2022-04-07 VITALS — BP 122/76 | HR 81 | Temp 98.0°F | Resp 15 | Ht 64.0 in | Wt 209.4 lb

## 2022-04-07 DIAGNOSIS — F419 Anxiety disorder, unspecified: Secondary | ICD-10-CM

## 2022-04-07 DIAGNOSIS — K5909 Other constipation: Secondary | ICD-10-CM

## 2022-04-07 DIAGNOSIS — K581 Irritable bowel syndrome with constipation: Secondary | ICD-10-CM

## 2022-04-07 MED ORDER — TRIAMCINOLONE ACETONIDE 0.1 % EX CREA: TOPICAL_CREAM | Freq: Two times a day (BID) | CUTANEOUS | 0 refills | Status: AC | PRN

## 2022-04-07 NOTE — Progress Notes (Signed)
Patient ID: Diana [name], female   DOB: Jan 16, 2001, 21 y.o.   MRN: [id number]   Subjective:    Patient ID: Diana [name], female    DOB: [birth year], 21 y.o.   MRN: [id number]   Patient here for a scheduled follow up.   Chief Complaint  Patient presents with   Medical Management of Chronic Issues   .   HPI She is accompanied by her mother.  History obtained from both of them.  She is currently on prozac and buspar.  Is dong some better.  Still with increased anxiety.  Seeing Dr Maryruth Bun.  Has f/u scheduled tomorrow.  No chest pain or sob reported.  Has had issues with constipation.  Taking lactulose now.  Due to f/u with GI soon.     Past Medical History:  Diagnosis Date   Abdominal pain    Anxiety    Phreesia 10/17/2019   Constipation    IBS (irritable bowel syndrome)    Reflux    Viral meningitis 19-Jan-2002   Past Surgical History:  Procedure Laterality Date   WISDOM TOOTH EXTRACTION     Family History  Problem Relation Age of Onset   Arthritis Brother    Asthma Brother    Rheum arthritis Brother    Endometrial cancer Mother    Anxiety disorder Mother    Hearing loss Father    Hypertension Father    Kidney Stones Father    Breast cancer Maternal Grandmother    Alzheimer's disease Maternal Grandmother    Hypertension Maternal Grandmother    Early death Maternal Grandfather    Macular degeneration Paternal Grandmother    Hypertension Paternal Grandmother    Arthritis Paternal Grandmother    Kidney Stones Paternal Grandmother    Heart disease Paternal Grandfather    Kidney disease Paternal Grandfather    Hirschsprung's disease Neg Hx    GER disease Neg Hx    Social History   Socioeconomic History   Marital status: Single    Spouse name: Not on file   Number of children: Not on file   Years of education: Not on file   Highest education level: Not on file  Occupational History   Not on file  Tobacco Use   Smoking status: Never   Smokeless tobacco: Never   Substance and Sexual Activity   Alcohol use: Never   Drug use: Never   Sexual activity: Never  Other Topics Concern   Not on file  Social History Narrative   Lives with with mom, dad, and her brother Ree Kida   She will start 12th grade in the fall at [employer].    Social Determinants of Health   Financial Resource Strain: Not on file  Food Insecurity: Not on file  Transportation Needs: Not on file  Physical Activity: Not on file  Stress: Not on file  Social Connections: Not on file     Review of Systems  Constitutional:  Negative for appetite change and unexpected weight change.  HENT:  Negative for congestion and sinus pressure.   Respiratory:  Negative for cough, chest tightness and shortness of breath.   Cardiovascular:  Negative for chest pain, palpitations and leg swelling.  Gastrointestinal:  Positive for constipation. Negative for abdominal pain, diarrhea, nausea and vomiting.  Genitourinary:  Negative for difficulty urinating and dysuria.  Musculoskeletal:  Negative for joint swelling and myalgias.  Skin:  Negative for color change and rash.  Neurological:  Negative for dizziness, light-headedness and headaches.  Psychiatric/Behavioral:  Negative for agitation and dysphoric mood.        Increased stress and anxiety as outlined.        Objective:     BP 122/76 (BP Location: Left Arm, Patient Position: Sitting, Cuff Size: Large)   Pulse 81   Temp 98 F (36.7 C) (Temporal)   Resp 15   Ht 5\' 4"  (1.626 m)   Wt 209 lb 6.4 oz (95 kg)   SpO2 98%   BMI 35.94 kg/m  Wt Readings from Last 3 Encounters:  04/07/22 209 lb 6.4 oz (95 kg)  09/26/21 201 lb 3.2 oz (91.3 kg) (97 %, Z= 1.96)*  08/01/21 197 lb 9.6 oz (89.6 kg) (97 %, Z= 1.91)*   * Growth percentiles are based on CDC (Girls, 2-20 Years) data.    Physical Exam Vitals reviewed.  Constitutional:      General: She is not in acute distress.    Appearance: Normal appearance.  HENT:      Head: Normocephalic and atraumatic.     Right Ear: External ear normal.     Left Ear: External ear normal.  Eyes:     General: No scleral icterus.       Right eye: No discharge.        Left eye: No discharge.     Conjunctiva/sclera: Conjunctivae normal.  Neck:     Thyroid: No thyromegaly.  Cardiovascular:     Rate and Rhythm: Normal rate and regular rhythm.  Pulmonary:     Effort: No respiratory distress.     Breath sounds: Normal breath sounds. No wheezing.  Abdominal:     General: Bowel sounds are normal.     Palpations: Abdomen is soft.     Tenderness: There is no abdominal tenderness.  Musculoskeletal:        General: No swelling or tenderness.     Cervical back: Neck supple. No tenderness.  Lymphadenopathy:     Cervical: No cervical adenopathy.  Skin:    Findings: No erythema or rash.  Neurological:     Mental Status: She is alert.  Psychiatric:        Mood and Affect: Mood normal.        Behavior: Behavior normal.      Outpatient Encounter Medications as of 04/07/2022  Medication Sig   Biotin 5000 MCG TABS Take by mouth.   busPIRone (BUSPAR) 5 MG tablet TAKE 1 TABLET BY MOUTH TWICE A DAY   Calcium Polycarbophil (FIBER) 625 MG TABS Take by mouth.   Cholecalciferol (VITAMIN D3) 25 MCG (1000 UT) CAPS Take by mouth.   Lactulose 20 GM/30ML SOLN    linaclotide (LINZESS) 145 MCG CAPS capsule Take 1 capsule (145 mcg total) by mouth daily before breakfast.   Magnesium Glycinate 100 MG CAPS On capsule q day   [DISCONTINUED] buPROPion (WELLBUTRIN XL) 300 MG 24 hr tablet Take 1 tablet (300 mg total) by mouth daily.   [DISCONTINUED] triamcinolone cream (KENALOG) 0.1 % Apply topically 2 (two) times daily as needed.   triamcinolone cream (KENALOG) 0.1 % Apply topically 2 (two) times daily as needed.   No facility-administered encounter medications on file as of 04/07/2022.     Lab Results  Component Value Date   WBC 3.1 (L) 04/13/2019   HGB 14.0 04/13/2019   HCT 40.3  04/13/2019   PLT 223 04/13/2019   GLUCOSE 116 (H) 04/13/2019   ALT 14 04/13/2019   AST 17 04/13/2019   NA 139 04/13/2019   K 3.9  04/13/2019   CL 104 04/13/2019   CREATININE 0.77 04/13/2019   BUN 8 04/13/2019   CO2 25 04/13/2019    DG Chest Portable 1 View  Result Date: 04/13/2019 CLINICAL DATA:  21 year old COVID-19 positive patient with fatigue, cough, chest congestion and shortness of breath. EXAM: PORTABLE CHEST 1 VIEW COMPARISON:  None. FINDINGS: Cardiac silhouette and mediastinal contours normal in appearance for the AP portable technique. Pulmonary parenchyma clear. Bronchovascular markings normal. Pulmonary vascularity normal. No pneumothorax. No visible pleural effusions. IMPRESSION: No acute cardiopulmonary disease. Electronically Signed   By: Hulan Saas M.D.   On: 04/13/2019 11:27       Assessment & Plan:   Problem List Items Addressed This Visit     Anxiety - Primary    Currently on prozac and appears to be [name] better on this medication.  Seeing Dr Maryruth Bun.  F/u appt scheduled tomorrow.  Follow.       Chronic constipation    Saw GI.  Linzess works well.  Apparently received authorization from the insurance, but still had high out of pocket cost.  Continue f/u with GI.  Using lactulose currently, which is helping.  Keep f/u with GI.       IBS (irritable bowel syndrome)    Is followed by GI.  linzess worked well. Cost is an issue.  Taking lactulose now.  Keep f/u with GI as outlined.        Dale Bennett, MD

## 2022-04-19 ENCOUNTER — Encounter: Payer: Self-pay | Admitting: Internal Medicine

## 2022-04-19 NOTE — Assessment & Plan Note (Signed)
Saw GI.  Linzess works well.  Apparently received authorization from the insurance, but still had high out of pocket cost.  Continue f/u with GI.  Using lactulose currently, which is helping.  Keep f/u with GI.

## 2022-04-19 NOTE — Assessment & Plan Note (Signed)
Currently on prozac and appears to be doing better on this medication.  Seeing Dr Maryruth Bun.  F/u appt scheduled tomorrow.  Follow.

## 2022-04-19 NOTE — Assessment & Plan Note (Signed)
Is followed by GI.  linzess worked well. Cost is an issue.  Taking lactulose now.  Keep f/u with GI as outlined.

## 2022-04-23 DIAGNOSIS — R69 Illness, unspecified: Secondary | ICD-10-CM | POA: Diagnosis not present

## 2022-05-08 DIAGNOSIS — F429 Obsessive-compulsive disorder, unspecified: Secondary | ICD-10-CM | POA: Diagnosis not present

## 2022-05-08 DIAGNOSIS — R69 Illness, unspecified: Secondary | ICD-10-CM | POA: Diagnosis not present

## 2022-05-08 DIAGNOSIS — F325 Major depressive disorder, single episode, in full remission: Secondary | ICD-10-CM | POA: Diagnosis not present

## 2022-05-22 DIAGNOSIS — R69 Illness, unspecified: Secondary | ICD-10-CM | POA: Diagnosis not present

## 2022-06-12 DIAGNOSIS — F429 Obsessive-compulsive disorder, unspecified: Secondary | ICD-10-CM | POA: Diagnosis not present

## 2022-06-26 DIAGNOSIS — F429 Obsessive-compulsive disorder, unspecified: Secondary | ICD-10-CM | POA: Diagnosis not present

## 2022-07-10 DIAGNOSIS — F411 Generalized anxiety disorder: Secondary | ICD-10-CM | POA: Diagnosis not present

## 2022-07-10 DIAGNOSIS — F429 Obsessive-compulsive disorder, unspecified: Secondary | ICD-10-CM | POA: Diagnosis not present

## 2022-07-10 DIAGNOSIS — F325 Major depressive disorder, single episode, in full remission: Secondary | ICD-10-CM | POA: Diagnosis not present

## 2022-07-24 DIAGNOSIS — F429 Obsessive-compulsive disorder, unspecified: Secondary | ICD-10-CM | POA: Diagnosis not present

## 2022-08-04 DIAGNOSIS — F411 Generalized anxiety disorder: Secondary | ICD-10-CM | POA: Diagnosis not present

## 2022-08-04 DIAGNOSIS — F325 Major depressive disorder, single episode, in full remission: Secondary | ICD-10-CM | POA: Diagnosis not present

## 2022-08-04 DIAGNOSIS — F429 Obsessive-compulsive disorder, unspecified: Secondary | ICD-10-CM | POA: Diagnosis not present

## 2022-08-07 ENCOUNTER — Encounter: Payer: Self-pay | Admitting: Internal Medicine

## 2022-08-07 ENCOUNTER — Ambulatory Visit (INDEPENDENT_AMBULATORY_CARE_PROVIDER_SITE_OTHER): Payer: Self-pay | Admitting: Internal Medicine

## 2022-08-07 VITALS — BP 100/62 | HR 89 | Temp 98.2°F | Resp 16 | Ht 64.0 in | Wt 211.0 lb

## 2022-08-07 DIAGNOSIS — K5909 Other constipation: Secondary | ICD-10-CM

## 2022-08-07 DIAGNOSIS — Z0000 Encounter for general adult medical examination without abnormal findings: Secondary | ICD-10-CM

## 2022-08-07 DIAGNOSIS — R339 Retention of urine, unspecified: Secondary | ICD-10-CM | POA: Insufficient documentation

## 2022-08-07 DIAGNOSIS — F429 Obsessive-compulsive disorder, unspecified: Secondary | ICD-10-CM | POA: Diagnosis not present

## 2022-08-07 DIAGNOSIS — F419 Anxiety disorder, unspecified: Secondary | ICD-10-CM

## 2022-08-07 LAB — URINALYSIS, ROUTINE W REFLEX MICROSCOPIC
Bilirubin Urine: NEGATIVE
Hgb urine dipstick: NEGATIVE
Ketones, ur: NEGATIVE
Leukocytes,Ua: NEGATIVE
Nitrite: NEGATIVE
RBC / HPF: NONE SEEN (ref 0–?)
Specific Gravity, Urine: 1.015 (ref 1.000–1.030)
Total Protein, Urine: NEGATIVE
Urine Glucose: NEGATIVE
Urobilinogen, UA: 0.2 (ref 0.0–1.0)
pH: 7.5 (ref 5.0–8.0)

## 2022-08-07 NOTE — Progress Notes (Unsigned)
Subjective:    Patient ID: Diana [name], female    DOB: Jan 22, 2001, 21 y.o.   MRN: [id number]  Patient here for  Chief Complaint  Patient presents with   Annual Exam   Urinary Retention    Been going on for a month and half    HPI Here for a physical exam.  Seeing Dr Maryruth Bun - increased anxiety.  On prozac.  Has done relatively well on this medication. Does report noticing some issues with what she feels is some urinary retention.  States after urinating, will need to stand - then rest of urine will come out.  She was questioning if related to the prozac.  Wants to confirm no infection.  Denies dysuria.  No hematuria.  No abdominal pain or pressure.  Still issues with constipation.  Using lactulose.  Linzess worked but cost is issue.     Seeing GI for chronic constipation.   Past Medical History:  Diagnosis Date   Abdominal pain    Anxiety    Phreesia 10/17/2019   Constipation    IBS (irritable bowel syndrome)    Reflux    Viral meningitis November 17, 2001   Past Surgical History:  Procedure Laterality Date   WISDOM TOOTH EXTRACTION     Family History  Problem Relation Age of Onset   Arthritis Brother    Asthma Brother    Rheum arthritis Brother    Endometrial cancer Mother    Anxiety disorder Mother    Hearing loss Father    Hypertension Father    Kidney Stones Father    Breast cancer Maternal Grandmother    Alzheimer's disease Maternal Grandmother    Hypertension Maternal Grandmother    Early death Maternal Grandfather    Macular degeneration Paternal Grandmother    Hypertension Paternal Grandmother    Arthritis Paternal Grandmother    Kidney Stones Paternal Grandmother    Heart disease Paternal Grandfather    Kidney disease Paternal Grandfather    Hirschsprung's disease Neg Hx    GER disease Neg Hx    Social History   Socioeconomic History   Marital status: Single    Spouse name: Not on file   Number of children: Not on file   Years of education: Not on file    Highest education level: Not on file  Occupational History   Not on file  Tobacco Use   Smoking status: Never   Smokeless tobacco: Never  Substance and Sexual Activity   Alcohol use: Never   Drug use: Never   Sexual activity: Never  Other Topics Concern   Not on file  Social History Narrative   Lives with with mom, dad, and her brother Ree Kida   She will start 12th grade in the fall at [employer].    Social Determinants of Health   Financial Resource Strain: Not on file  Food Insecurity: Not on file  Transportation Needs: Not on file  Physical Activity: Not on file  Stress: Not on file  Social Connections: Not on file     Review of Systems  Constitutional:  Negative for appetite change and unexpected weight change.  HENT:  Negative for congestion, sinus pressure and sore throat.   Eyes:  Negative for pain and visual disturbance.  Respiratory:  Negative for cough, chest tightness and shortness of breath.   Cardiovascular:  Negative for chest pain and palpitations.  Gastrointestinal:  Positive for constipation. Negative for abdominal pain, diarrhea, nausea and vomiting.  Genitourinary:  Negative for  difficulty urinating and dysuria.  Musculoskeletal:  Negative for joint swelling and myalgias.  Skin:  Negative for color change and rash.  Neurological:  Negative for dizziness and headaches.  Hematological:  Negative for adenopathy. Does not bruise/bleed easily.  Psychiatric/Behavioral:  Negative for agitation and dysphoric mood.        Objective:     BP 100/62   Pulse 89   Temp 98.2 F (36.8 C)   Resp 16   Ht  (1.626 m)   Wt 211 lb (95.7 kg)   LMP 07/27/2022 (Approximate)   SpO2 99%   BMI 36.22 kg/m  Wt Readings from Last 3 Encounters:  08/07/22 211 lb (95.7 kg)  04/07/22 209 lb 6.4 oz (95 kg)  09/26/21 201 lb 3.2 oz (91.3 kg) (97 %, Z= 1.96)*   * Growth percentiles are based on CDC (Girls, 2-20 Years) data.    Physical Exam Vitals  reviewed.  Constitutional:      General: She is not in acute distress.    Appearance: Normal appearance. She is well-developed.  HENT:     Head: Normocephalic and atraumatic.     Right Ear: External ear normal.     Left Ear: External ear normal.  Eyes:     General: No scleral icterus.       Right eye: No discharge.        Left eye: No discharge.     Conjunctiva/sclera: Conjunctivae normal.  Neck:     Thyroid: No thyromegaly.  Cardiovascular:     Rate and Rhythm: Normal rate and regular rhythm.  Pulmonary:     Effort: No tachypnea, accessory muscle usage or respiratory distress.     Breath sounds: Normal breath sounds. No decreased breath sounds or wheezing.  Chest:  Breasts:    Right: No inverted nipple, mass, nipple discharge or tenderness (no axillary adenopathy).     Left: No inverted nipple, mass, nipple discharge or tenderness (no axilarry adenopathy).  Abdominal:     General: Bowel sounds are normal.     Palpations: Abdomen is soft.     Tenderness: There is no abdominal tenderness.  Musculoskeletal:        General: No swelling or tenderness.     Cervical back: Neck supple.  Lymphadenopathy:     Cervical: No cervical adenopathy.  Skin:    Findings: No erythema or rash.  Neurological:     Mental Status: She is alert and oriented to person, place, and time.  Psychiatric:        Mood and Affect: Mood normal.        Behavior: Behavior normal.      Outpatient Encounter Medications as of 08/07/2022  Medication Sig   Calcium Polycarbophil (FIBER) 625 MG TABS Take by mouth.   Cholecalciferol (VITAMIN D3) 25 MCG (1000 UT) CAPS Take by mouth.   FLUoxetine (PROZAC) 40 MG capsule Take 40 mg by mouth daily.   Lactulose 20 GM/30ML SOLN    Magnesium Glycinate 100 MG CAPS On capsule q day   triamcinolone cream (KENALOG) 0.1 % Apply topically 2 (two) times daily as needed.   linaclotide (LINZESS) 145 MCG CAPS capsule Take 1 capsule (145 mcg total) by mouth daily before  breakfast. (Patient not taking: Reported on 08/07/2022)   [DISCONTINUED] Biotin 5000 MCG TABS Take by mouth. (Patient not taking: Reported on 08/07/2022)   [DISCONTINUED] busPIRone (BUSPAR) 5 MG tablet TAKE 1 TABLET BY MOUTH TWICE A DAY (Patient not taking: Reported on 08/07/2022)  No facility-administered encounter medications on file as of 08/07/2022.     Lab Results  Component Value Date   WBC 3.1 (L) 04/13/2019   HGB 14.0 04/13/2019   HCT 40.3 04/13/2019   PLT 223 04/13/2019   GLUCOSE 116 (H) 04/13/2019   ALT 14 04/13/2019   AST 17 04/13/2019   NA 139 04/13/2019   K 3.9 04/13/2019   CL 104 04/13/2019   CREATININE 0.77 04/13/2019   BUN 8 04/13/2019   CO2 25 04/13/2019    DG Chest Portable 1 View  Result Date: 04/13/2019 CLINICAL DATA:  22 year old COVID-19 positive patient with fatigue, cough, chest congestion and shortness of breath. EXAM: PORTABLE CHEST 1 VIEW COMPARISON:  None. FINDINGS: Cardiac silhouette and mediastinal contours normal in appearance for the AP portable technique. Pulmonary parenchyma clear. Bronchovascular markings normal. Pulmonary vascularity normal. No pneumothorax. No visible pleural effusions. IMPRESSION: No acute cardiopulmonary disease. Electronically Signed   By: Hulan Saas M.D.   On: 04/13/2019 11:27       Assessment & Plan:  Routine general medical examination at a health care facility  Anxiety Assessment & Plan: Currently on prozac and appears to be [name] better on this medication.  Seeing Dr Maryruth Bun.  Follow.    Chronic constipation Assessment & Plan: Saw GI.  Linzess works well.  Apparently received authorization from the insurance, but still had high out of pocket cost.  Continue f/u with GI.  Using lactulose currently, which is helping.  Keep f/u with GI. Will d/w pharmacy regarding help with medication.   Orders: -     CBC with Differential/Platelet; Future -     Comprehensive metabolic panel; Future -     TSH; Future  Urinary  retention Assessment & Plan: Reported noticing urinary retention as outlined.  Check urine to confirm no infection.  Plans to d/w Dr Maryruth Bun regarding prozac.   Orders: -     Urinalysis, Routine w reflex microscopic  Healthcare maintenance Assessment & Plan: Physical today 08/07/22.  Discussed pap - will do pap next year.       Dale Weston, MD

## 2022-08-08 ENCOUNTER — Encounter: Payer: Self-pay | Admitting: Internal Medicine

## 2022-08-08 DIAGNOSIS — Z0000 Encounter for general adult medical examination without abnormal findings: Secondary | ICD-10-CM | POA: Insufficient documentation

## 2022-08-08 NOTE — Assessment & Plan Note (Signed)
Reported noticing urinary retention as outlined.  Check urine to confirm no infection.  Plans to d/w Dr Maryruth Bun regarding prozac.

## 2022-08-08 NOTE — Assessment & Plan Note (Signed)
Physical today 08/07/22.  Discussed pap - will do pap next year.

## 2022-08-08 NOTE — Assessment & Plan Note (Signed)
Currently on prozac and appears to be doing better on this medication.  Seeing Dr Maryruth Bun.  Follow.

## 2022-08-08 NOTE — Assessment & Plan Note (Signed)
Saw GI.  Linzess works well.  Apparently received authorization from the insurance, but still had high out of pocket cost.  Continue f/u with GI.  Using lactulose currently, which is helping.  Keep f/u with GI. Will d/w pharmacy regarding help with medication.

## 2022-08-21 DIAGNOSIS — F429 Obsessive-compulsive disorder, unspecified: Secondary | ICD-10-CM | POA: Diagnosis not present

## 2022-08-26 ENCOUNTER — Telehealth: Payer: Self-pay | Admitting: Internal Medicine

## 2022-08-26 DIAGNOSIS — K5909 Other constipation: Secondary | ICD-10-CM

## 2022-08-26 NOTE — Telephone Encounter (Signed)
I had talked with Diana [name] previously about help with medication.  She has issues with significant constipation.  Linzess has been the medication that works best for her.  Unable to afford.  I thought we had already referred or checked on getting help with medication.  Can we see if there is any help for cost of medication and let her know.  Thanks

## 2022-08-27 NOTE — Telephone Encounter (Signed)
Ok

## 2022-08-27 NOTE — Telephone Encounter (Signed)
Pharmacy referral placed. Called patient to let her know, unable to leave message.

## 2022-08-27 NOTE — Addendum Note (Signed)
Addended by: Rita Ohara D on: 08/27/2022 02:00 PM   Modules accepted: Orders

## 2022-08-27 NOTE — Telephone Encounter (Signed)
I do not see where we have referred to pharmacy team. Ok to place referral?

## 2022-08-28 ENCOUNTER — Telehealth: Payer: Self-pay

## 2022-08-28 NOTE — Progress Notes (Signed)
   Care Guide Note  08/28/2022 Name: Diana [name] MRN: [id number] DOB: 2000/11/14  Referred by: Dale Casey, MD Reason for referral : Care Coordination (Outreach to schedule with Pharm d )   Diana [name] is a 22 y.o. year old female who is a primary care patient of Dale Odebolt, MD. [name] was referred to the pharmacist for assistance related to  Chronic constipation  .    An unsuccessful telephone outreach was attempted today to contact the patient who was referred to the pharmacy team for assistance with medication assistance. Additional attempts will be made to contact the patient.   [name], RMA Care Guide [facility]  [place], [place] [address] Direct Dial: [phone] [email or web address]

## 2022-09-01 NOTE — Progress Notes (Signed)
   Care Guide Note  09/01/2022 Name: Diana [name] MRN: [id number] DOB: 12/20/2000  Referred by: Dale Ferney, MD Reason for referral : Care Coordination (Outreach to schedule with Pharm d )   Diana [name] is a 22 y.o. year old female who is a primary care patient of Dale [place], MD. [name] was referred to the pharmacist for assistance related to  other .    A second unsuccessful telephone outreach was attempted today to contact the patient who was referred to the pharmacy team for assistance with medication assistance. Additional attempts will be made to contact the patient.  [name], RMA Care Guide [facility]  [place], [place] [address] Direct Dial: [phone] [email or web address]

## 2022-09-02 DIAGNOSIS — F429 Obsessive-compulsive disorder, unspecified: Secondary | ICD-10-CM | POA: Diagnosis not present

## 2022-09-02 DIAGNOSIS — F325 Major depressive disorder, single episode, in full remission: Secondary | ICD-10-CM | POA: Diagnosis not present

## 2022-09-02 DIAGNOSIS — F411 Generalized anxiety disorder: Secondary | ICD-10-CM | POA: Diagnosis not present

## 2022-09-03 DIAGNOSIS — F429 Obsessive-compulsive disorder, unspecified: Secondary | ICD-10-CM | POA: Diagnosis not present

## 2022-09-07 NOTE — Progress Notes (Signed)
   Care Guide Note  09/07/2022 Name: Adisson Luebbering MRN: [id number] DOB: 2000/08/05  Referred by: Dale Ecru, MD Reason for referral : Care Coordination (Outreach to schedule with Pharm d )   Erleen Lockmiller is a 22 y.o. year old female who is a primary care patient of Dale Salisbury, MD. [name] was referred to the pharmacist for assistance related to  other .    Successful contact was made with the patient to discuss pharmacy services including being ready for the pharmacist to call at least 5 minutes before the scheduled appointment time, to have medication bottles and any blood sugar or blood pressure readings ready for review. The patient agreed to meet with the pharmacist via with the pharmacist via telephone visit on (date/time).  09/29/2022  [name], RMA Care Guide [facility]  [place], [place] [address] Direct Dial: [phone] [email or web address]

## 2022-09-29 ENCOUNTER — Other Ambulatory Visit (HOSPITAL_COMMUNITY): Payer: Self-pay

## 2022-09-29 ENCOUNTER — Other Ambulatory Visit: Payer: Self-pay | Admitting: Pharmacist

## 2022-09-29 NOTE — Progress Notes (Signed)
   09/29/2022 Name: Diana [name] MRN: [id number] DOB: Feb 04, 2001  Chief Complaint  Patient presents with   Medication Management    Diana [name] is a 22 y.o. year old female who presented for a telephone visit.   They were referred to the pharmacist by their PCP for assistance in managing medication access.    Subjective:  Care Team: Primary Care Provider: Dale Foosland, MD ; Next Scheduled Visit: 02/08/23 GI: [place]; Next Scheduled Visit; 10/13/22  Medication Access/Adherence  Current Pharmacy:  [facility] [address], [place] - [address] [place] [address] Phone: [phone] Fax: [phone]  [id number] - [place], [place] - [address] [place] [address] Phone: [phone] Fax: [phone]   Patient reports affordability concerns with their medications: Yes  Patient reports access/transportation concerns to their pharmacy: No  Patient reports adherence concerns with their medications:  No    Student at [employer]. Reports when trying to change something with insurance, agent ended insurance. Should go back into effect July 1.    Chronic Constipation: Current therapy: prescribed Linzess 290 mcg daily, but has been unable to afford. Believes PA was previously done by GI, but last time she tried to fill it was still significantly expensive. Currently no insurance coverage.   Taking Lactulose daily at this time  Objective:  Lab Results  Component Value Date   CREATININE 0.77 04/13/2019   BUN 8 04/13/2019   NA 139 04/13/2019   K 3.9 04/13/2019   CL 104 04/13/2019   CO2 25 04/13/2019    No results found for: "CHOL", "HDL", "LDLCALC", "LDLDIRECT", "TRIG", "CHOLHDL"  Medications Reviewed Today     Reviewed by Alden Hipp, RPH-CPP (Pharmacist) on 09/29/22 at [phone]  Med List Status: <None>   Medication Order Taking? Sig Documenting Provider Last Dose Status Informant  Calcium Polycarbophil (FIBER) 625  MG TABS [id number]  Take by mouth. [provider]  Active   Cholecalciferol (VITAMIN D3) 25 MCG (1000 UT) CAPS [id number]  Take by mouth. [provider]  Active   FLUoxetine (PROZAC) 40 MG capsule [id number] Yes Take 40 mg by mouth daily. [provider] Taking Active   Lactulose 20 GM/30ML SOLN [id number] Yes  [provider] Taking Active   linaclotide Karlene Einstein) 145 MCG CAPS capsule [id number] No Take 1 capsule (145 mcg total) by mouth daily before breakfast.  Patient not taking: Reported on 08/07/2022   Dale Lynn, MD Not Taking Active   Magnesium Glycinate 100 MG CAPS [id number]  On capsule q day Dale [place], MD  Active   triamcinolone cream (KENALOG) 0.1 % [id number]  Apply topically 2 (two) times daily as needed. Dale [place], MD  Active               Assessment/Plan:   Chronic Constipation: - Uncontrolled due to medication access. - Given current lack of insurance coverage, could pursue patient assistance. Patient will double check household income. If over income, will need to wait until insurance takes effect in July to determine coverage and next steps.   Follow Up Plan: pending household income  Catie TClearance Coots, PharmD, [place], CPP [facility] [phone]

## 2022-09-30 ENCOUNTER — Other Ambulatory Visit: Payer: Self-pay

## 2022-09-30 ENCOUNTER — Encounter: Payer: Self-pay | Admitting: Pharmacist

## 2022-09-30 ENCOUNTER — Other Ambulatory Visit: Payer: Self-pay | Admitting: Pharmacist

## 2022-09-30 MED ORDER — LINACLOTIDE 145 MCG PO CAPS: 145.0000 ug | ORAL_CAPSULE | Freq: Every day | ORAL | 0 refills | Status: DC

## 2022-09-30 MED ORDER — LINZESS 290 MCG PO CAPS
290.0000 ug | ORAL_CAPSULE | Freq: Every day | ORAL | 5 refills | Status: AC
  Filled 2022-09-30: qty 30, 30d supply, fill #0

## 2022-09-30 NOTE — Progress Notes (Signed)
Care Coordination Call  Pharmacy noted they do not have a current stock of 290 mcg, but do have 145 mcg and can dispense 2 daily. Will send for short supply to tide over until patient assistance.   Catie Eppie Gibson, PharmD, BCACP, CPP [facility] [phone]

## 2022-09-30 NOTE — Progress Notes (Signed)
Care Coordination Call  Received call from patient, appears she will qualify for Linzess patient assistance while she is uninsured. Notified that Pharmacy at [facility] can help with patient assistance.   Contacted Dr. Horace Porteous office to request script for Linzess 290 mcg to [facility] at [facility]. Spoke with his nurse, she has sent the prescription.   Follow up scheduled in 4 weeks.   Catie Eppie Gibson, PharmD, BCACP, CPP [facility] [phone]

## 2022-09-30 NOTE — Addendum Note (Signed)
Addended by: Nilda Simmer T on: 09/30/2022 04:30 PM   Modules accepted: Orders

## 2022-10-01 ENCOUNTER — Other Ambulatory Visit: Payer: Self-pay

## 2022-10-05 ENCOUNTER — Other Ambulatory Visit: Payer: Self-pay

## 2022-10-09 ENCOUNTER — Other Ambulatory Visit: Payer: Self-pay

## 2022-10-13 DIAGNOSIS — K219 Gastro-esophageal reflux disease without esophagitis: Secondary | ICD-10-CM | POA: Diagnosis not present

## 2022-10-13 DIAGNOSIS — K5909 Other constipation: Secondary | ICD-10-CM | POA: Diagnosis not present

## 2022-10-13 DIAGNOSIS — K582 Mixed irritable bowel syndrome: Secondary | ICD-10-CM | POA: Diagnosis not present

## 2022-10-19 ENCOUNTER — Telehealth: Payer: Self-pay | Admitting: Internal Medicine

## 2022-10-19 ENCOUNTER — Other Ambulatory Visit: Payer: Self-pay

## 2022-10-19 NOTE — Telephone Encounter (Signed)
I am ok to place order for a referral, but need to know more specifics about her symptoms.  If she is not urinating now, needs to be seen more urgently - acute visit.

## 2022-10-19 NOTE — Telephone Encounter (Signed)
Pt mom called in stating she would like a referral sent to dr Nance Pew at [facility] because the pt is having UTIs and she is unable to empty her bladder

## 2022-10-20 DIAGNOSIS — F411 Generalized anxiety disorder: Secondary | ICD-10-CM | POA: Diagnosis not present

## 2022-10-20 DIAGNOSIS — F429 Obsessive-compulsive disorder, unspecified: Secondary | ICD-10-CM | POA: Diagnosis not present

## 2022-10-20 DIAGNOSIS — F325 Major depressive disorder, single episode, in full remission: Secondary | ICD-10-CM | POA: Diagnosis not present

## 2022-10-20 NOTE — Telephone Encounter (Signed)
Called pt. Mailbox is full

## 2022-10-21 ENCOUNTER — Ambulatory Visit (INDEPENDENT_AMBULATORY_CARE_PROVIDER_SITE_OTHER): Payer: Self-pay | Admitting: Urology

## 2022-10-21 ENCOUNTER — Encounter: Payer: Self-pay | Admitting: Urology

## 2022-10-21 VITALS — BP 105/74 | HR 101 | Ht 64.0 in | Wt 213.0 lb

## 2022-10-21 DIAGNOSIS — R339 Retention of urine, unspecified: Secondary | ICD-10-CM

## 2022-10-21 DIAGNOSIS — M6289 Other specified disorders of muscle: Secondary | ICD-10-CM

## 2022-10-21 NOTE — Patient Instructions (Addendum)
Pelvic Floor Dysfunction, Female  Pelvic floor dysfunction (PFD) is a condition that results when the group of muscles and connective tissues that support the organs in the pelvis (pelvic floor muscles) do not work well. These muscles and their connections form a sling that supports the colon and bladder. In women, they also support the uterus. PFD causes pelvic floor muscles to be too weak, too tight, or both. In PFD, muscle movements are not coordinated. This may cause bowel or bladder problems. It may also cause pain. What are the causes? This condition may be caused by an injury to the pelvic area or by a weakening of pelvic muscles. This often results from pregnancy and childbirth or other types of strain. In many cases, the exact cause is not known. What increases the risk? The following factors may make you more likely to develop this condition: Having chronic bladder tissue inflammation (interstitial cystitis). Being an older person. Being overweight. History of radiation treatment for cancer in the pelvic region. Previous pelvic surgery, such as removal of the uterus (hysterectomy). What are the signs or symptoms? Symptoms of this condition vary and may include: Bladder symptoms, such as: Trouble starting urination and emptying the bladder. Frequent urinary tract infections. Leaking urine when coughing, laughing, or exercising (stress incontinence). Having to pass urine urgently or frequently. Pain when passing urine. Bowel symptoms, such as: Constipation. Urgent or frequent bowel movements. Incomplete bowel movements. Painful bowel movements. Leaking stool or gas. Unexplained genital or rectal pain. Genital or rectal muscle spasms. Low back pain. Other symptoms may include: A heavy, full, or aching feeling in the vagina. A bulge that protrudes into the vagina. Pain during or after sex. How is this diagnosed? This condition may be diagnosed based on: Your symptoms and  medical history.  In some cases, you may have diagnostic tests, such as: Electrical muscle function tests. Urine flow testing. X-ray tests of bowel function. Ultrasound of the pelvic organs. How is this treated? Treatment for this condition depends on the symptoms. Treatment options include: Physical therapy. This may include Kegel exercises to help relax or strengthen the pelvic floor muscles. Biofeedback. This type of therapy provides feedback on how tight your pelvic floor muscles are so that you can learn to control them. Internal or external massage therapy. A treatment that involves electrical stimulation of the pelvic floor muscles to help control pain (transcutaneous electrical nerve stimulation, or TENS). Medicines, such as: Muscle relaxants. Bladder control medicines. Surgery to reconstruct or support pelvic floor muscles may be an option if other treatments do not help. Follow these instructions at home: Activity Do your usual activities as told by your health care provider. Ask your health care provider if you should modify any activities. Do pelvic floor strengthening or relaxing exercises at home as told by your physical therapist. Lifestyle Maintain a healthy weight. Eat foods that are high in fiber, such as beans, whole grains, and fresh fruits and vegetables. Limit foods that are high in fat and processed sugars, such as fried or sweet foods. Manage stress with relaxation techniques such as yoga or meditation. General instructions If you have problems with leakage: Use absorbable pads or wear padded underwear. Wash frequently with mild soap. Keep your genital and anal area as clean and dry as possible. Ask your health care provider if you should try a barrier cream to prevent skin irritation. Take warm baths to relieve pelvic muscle tension or spasms. Take over-the-counter and prescription medicines only as told by your health  care provider. Keep all follow-up  visits. How is this prevented? The cause of PFD is not always known, but there are a few things you can do to reduce the risk of developing this condition, including: Staying at a healthy weight. Getting regular exercise. Managing stress. Contact a health care provider if: Your symptoms are not improving with home care. You have signs or symptoms of PFD that get worse at home. You develop new signs or symptoms. You have signs of a urinary tract infection, such as: Fever. Chills. Increased urinary frequency. A burning feeling when urinating. You have not had a bowel movement in 3 days (constipation). Summary Pelvic floor dysfunction results when the muscles and connective tissues in your pelvic floor do not work well. These muscles and their connections form a sling that supports your colon and bladder. In women, they also support the uterus. PFD may be caused by an injury to the pelvic area or by a weakening of pelvic muscles. PFD causes pelvic floor muscles to be too weak, too tight, or a combination of both. Symptoms may vary from person to person. In most cases, PFD can be treated with physical therapies and medicines. Surgery may be an option if other treatments do not help. This information is not intended to replace advice given to you by your health care provider. Make sure you discuss any questions you have with your health care provider. Document Revised: 08/14/2020 Document Reviewed: 08/14/2020 Elsevier Patient Education  2024 Elsevier Inc.   Constipation, Adult Constipation is when a person has fewer than three bowel movements in a week, has difficulty having a bowel movement, or has stools (feces) that are dry, hard, or larger than normal. Constipation may be caused by an underlying condition. It may become worse with age if a person takes certain medicines and does not take in enough fluids. Follow these instructions at home: Eating and drinking  Eat foods that have a  lot of fiber, such as beans, whole grains, and fresh fruits and vegetables. Limit foods that are low in fiber and high in fat and processed sugars, such as fried or sweet foods. These include french fries, hamburgers, cookies, candies, and soda. Drink enough fluid to keep your urine pale yellow. General instructions Exercise regularly or as told by your health care provider. Try to do 150 minutes of moderate exercise each week. Use the bathroom when you have the urge to go. Do not hold it in. Take over-the-counter and prescription medicines only as told by your health care provider. This includes any fiber supplements. During bowel movements: Practice deep breathing while relaxing the lower abdomen. Practice pelvic floor relaxation. Watch your condition for any changes. Let your health care provider know about them. Keep all follow-up visits as told by your health care provider. This is important. Contact a health care provider if: You have pain that gets worse. You have a fever. You do not have a bowel movement after 4 days. You vomit. You are not hungry or you lose weight. You are bleeding from the opening between the buttocks (anus). You have thin, pencil-like stools. Get help right away if: You have a fever and your symptoms suddenly get worse. You leak stool or have blood in your stool. Your abdomen is bloated. You have severe pain in your abdomen. You feel dizzy or you faint. Summary Constipation is when a person has fewer than three bowel movements in a week, has difficulty having a bowel movement, or has stools (feces)  that are dry, hard, or larger than normal. Eat foods that have a lot of fiber, such as beans, whole grains, and fresh fruits and vegetables. Drink enough fluid to keep your urine pale yellow. Take over-the-counter and prescription medicines only as told by your health care provider. This includes any fiber supplements. This information is not intended to replace  advice given to you by your health care provider. Make sure you discuss any questions you have with your health care provider. Document Revised: 02/18/2022 Document Reviewed: 02/18/2022 Elsevier Patient Education  2024 [employer].

## 2022-10-21 NOTE — Progress Notes (Signed)
10/21/22 10:20 AM   Diana [name] 24-Mar-2001 [id number]  CC: Urinary symptoms, concern for possible urinary retention  HPI: 22 year old female with severe anxiety/OCD followed by psychiatry, as well as obesity (BMI 37 ), constipation and IBS who is referred for worsening urinary symptoms.  She was recently changed to Prozac, and felt that this worsened her urinary symptoms with a sensation of incomplete emptying and need to double void.  Urinalysis with PCP was benign, PVR today is normal at 0ml.  Denies any dysuria, urgency, or incontinence.  Recently changed to Linzess last week for constipation.  She has a diet soda every 1 to 2 days, otherwise drinks primarily water.   PMH: Past Medical History:  Diagnosis Date   Abdominal pain    Anxiety    Phreesia 10/17/2019   Constipation    IBS (irritable bowel syndrome)    Reflux    Viral meningitis January 30, 2002    Surgical History: Past Surgical History:  Procedure Laterality Date   WISDOM TOOTH EXTRACTION      Family History: Family History  Problem Relation Age of Onset   Arthritis Brother    Asthma Brother    Rheum arthritis Brother    Endometrial cancer Mother    Anxiety disorder Mother    Hearing loss Father    Hypertension Father    Kidney Stones Father    Breast cancer Maternal Grandmother    Alzheimer's disease Maternal Grandmother    Hypertension Maternal Grandmother    Early death Maternal Grandfather    Macular degeneration Paternal Grandmother    Hypertension Paternal Grandmother    Arthritis Paternal Grandmother    Kidney Stones Paternal Grandmother    Heart disease Paternal Grandfather    Kidney disease Paternal Grandfather    Hirschsprung's disease Neg Hx    GER disease Neg Hx     Social History:  reports that she has never smoked. She has never been exposed to tobacco smoke. She has never used smokeless tobacco. She reports that she does not drink alcohol and does not use drugs.  Physical Exam: BP  105/74   Pulse (!) 101   Ht 5\' 4"  (1.626 m)   Wt 213 lb (96.6 kg)   BMI 36.56 kg/m    Constitutional:  Alert and oriented, No acute distress. Cardiovascular: No clubbing, cyanosis, or edema. Respiratory: Normal respiratory effort, no increased work of breathing. GI: Abdomen is soft, nontender, nondistended, no abdominal masses   Laboratory Data: See HPI, urinalysis benign  Assessment & Plan:   21 year old female with obesity, severe anxiety/OCD, recently changed to Prozac with new urinary symptoms of sensation of incomplete emptying.  Urinalysis benign with PCP, and PVR normal today at 0 mL.  We discussed the complexities of her urinary symptoms in the setting of her other medical issues and possible range of etiologies including anxiety, constipation, and pelvic floor dysfunction.  We reviewed the AUA guidelines that recommend an algorithmic approach to treatment for these patients, and that a trial of different strategies is sometimes needed to find the approach that works best for each patient's unique situation.  I reinforced the importance of stress management, relaxation, management of her constipation, and medication adjustment from psychiatry and PCP regarding her anxiety/OCD.  I also discussed her case with Dr. Maryruth Bun from psychiatry, agree that improved control of her anxiety/OCD and constipation will likely improve her urinary symptoms.  -Reassurance provided regarding normal urinalysis and normal PVR of 0ml.  No indication of urinary retention.  Anticipate that  improved management of her anxiety/OCD, constipation, weight loss, avoiding bladder irritants will all improve her urinary symptoms. -Referral placed to pelvic floor physical therapy -RTC PA symptom check 6 to 8 weeks  Legrand Rams, MD 10/21/2022  [facility] [address] [place], [place] [address] [phone]

## 2022-10-27 NOTE — Telephone Encounter (Signed)
Per chart review, patient has seen Dr Richardo Hanks.

## 2022-11-04 ENCOUNTER — Other Ambulatory Visit: Payer: Self-pay

## 2022-11-09 ENCOUNTER — Ambulatory Visit: Payer: Self-pay | Admitting: Urology

## 2022-11-09 DIAGNOSIS — F325 Major depressive disorder, single episode, in full remission: Secondary | ICD-10-CM | POA: Diagnosis not present

## 2022-11-09 DIAGNOSIS — F411 Generalized anxiety disorder: Secondary | ICD-10-CM | POA: Diagnosis not present

## 2022-11-09 DIAGNOSIS — F429 Obsessive-compulsive disorder, unspecified: Secondary | ICD-10-CM | POA: Diagnosis not present

## 2022-11-11 ENCOUNTER — Encounter: Payer: Self-pay | Admitting: Pharmacist

## 2022-11-11 DIAGNOSIS — F429 Obsessive-compulsive disorder, unspecified: Secondary | ICD-10-CM | POA: Diagnosis not present

## 2022-11-12 ENCOUNTER — Other Ambulatory Visit: Payer: Self-pay | Admitting: Pharmacist

## 2022-11-19 DIAGNOSIS — F330 Major depressive disorder, recurrent, mild: Secondary | ICD-10-CM | POA: Diagnosis not present

## 2022-11-19 DIAGNOSIS — F429 Obsessive-compulsive disorder, unspecified: Secondary | ICD-10-CM | POA: Diagnosis not present

## 2022-12-01 DIAGNOSIS — F325 Major depressive disorder, single episode, in full remission: Secondary | ICD-10-CM | POA: Diagnosis not present

## 2022-12-01 DIAGNOSIS — F429 Obsessive-compulsive disorder, unspecified: Secondary | ICD-10-CM | POA: Diagnosis not present

## 2022-12-01 DIAGNOSIS — F411 Generalized anxiety disorder: Secondary | ICD-10-CM | POA: Diagnosis not present

## 2022-12-08 ENCOUNTER — Encounter: Payer: Self-pay | Admitting: Internal Medicine

## 2022-12-08 DIAGNOSIS — R339 Retention of urine, unspecified: Secondary | ICD-10-CM

## 2022-12-08 NOTE — Telephone Encounter (Signed)
Order placed for urogyn referral - as requested.

## 2022-12-09 ENCOUNTER — Ambulatory Visit: Payer: Self-pay | Admitting: Physician Assistant

## 2022-12-11 ENCOUNTER — Ambulatory Visit: Payer: Self-pay | Admitting: Physician Assistant

## 2022-12-11 DIAGNOSIS — F330 Major depressive disorder, recurrent, mild: Secondary | ICD-10-CM | POA: Diagnosis not present

## 2022-12-11 DIAGNOSIS — F429 Obsessive-compulsive disorder, unspecified: Secondary | ICD-10-CM | POA: Diagnosis not present

## 2022-12-13 DIAGNOSIS — F325 Major depressive disorder, single episode, in full remission: Secondary | ICD-10-CM | POA: Diagnosis not present

## 2022-12-13 DIAGNOSIS — F411 Generalized anxiety disorder: Secondary | ICD-10-CM | POA: Diagnosis not present

## 2022-12-13 DIAGNOSIS — F429 Obsessive-compulsive disorder, unspecified: Secondary | ICD-10-CM | POA: Diagnosis not present

## 2022-12-25 DIAGNOSIS — F330 Major depressive disorder, recurrent, mild: Secondary | ICD-10-CM | POA: Diagnosis not present

## 2022-12-25 DIAGNOSIS — F429 Obsessive-compulsive disorder, unspecified: Secondary | ICD-10-CM | POA: Diagnosis not present

## 2023-01-15 DIAGNOSIS — F330 Major depressive disorder, recurrent, mild: Secondary | ICD-10-CM | POA: Diagnosis not present

## 2023-01-15 DIAGNOSIS — F429 Obsessive-compulsive disorder, unspecified: Secondary | ICD-10-CM | POA: Diagnosis not present

## 2023-02-08 ENCOUNTER — Ambulatory Visit: Payer: Self-pay | Admitting: Internal Medicine

## 2023-02-12 ENCOUNTER — Ambulatory Visit (INDEPENDENT_AMBULATORY_CARE_PROVIDER_SITE_OTHER): Payer: 59 | Admitting: Internal Medicine

## 2023-02-12 VITALS — BP 116/72 | HR 88 | Temp 98.0°F | Resp 16 | Ht 64.0 in | Wt 213.6 lb

## 2023-02-12 DIAGNOSIS — F419 Anxiety disorder, unspecified: Secondary | ICD-10-CM

## 2023-02-12 DIAGNOSIS — R198 Other specified symptoms and signs involving the digestive system and abdomen: Secondary | ICD-10-CM

## 2023-02-12 DIAGNOSIS — K5909 Other constipation: Secondary | ICD-10-CM

## 2023-02-12 DIAGNOSIS — Z13220 Encounter for screening for lipoid disorders: Secondary | ICD-10-CM

## 2023-02-12 DIAGNOSIS — R339 Retention of urine, unspecified: Secondary | ICD-10-CM

## 2023-02-12 DIAGNOSIS — F330 Major depressive disorder, recurrent, mild: Secondary | ICD-10-CM | POA: Diagnosis not present

## 2023-02-12 DIAGNOSIS — F429 Obsessive-compulsive disorder, unspecified: Secondary | ICD-10-CM | POA: Diagnosis not present

## 2023-02-12 NOTE — Progress Notes (Unsigned)
Subjective:    Patient ID: Diana [name], female    DOB: October 09, 2000, 22 y.o.   MRN: [id number]  Patient here for  Chief Complaint  Patient presents with   Medical Management of Chronic Issues    HPI Here for a scheduled follow up.  She is accompanied by her mother.  History obtained from both of them. Follow up regarding increased stress and GI issues. Seeing GI.  Planning for EGD/colonoscopy in January. Taking linzess every other day. Still having some bowel issues.  May be more loose at times.  Still having some urinary issues. Has seen urology. Felt to have pelvic floor dysfunction and discussed referral for pelvic floor therapy. Planning to see Dr Brantley Persons - [facility]. Has appt in 02/2023.  Taking prozac - lower dose now. Discussed ADHD testing.  Breathing stable. Mother had questions about autoimmune disorders, celiac, etc.    Past Medical History:  Diagnosis Date   Abdominal pain    Anxiety    Phreesia 10/17/2019   Constipation    IBS (irritable bowel syndrome)    Reflux    Viral meningitis 2001/04/26   Past Surgical History:  Procedure Laterality Date   WISDOM TOOTH EXTRACTION     Family History  Problem Relation Age of Onset   Arthritis Brother    Asthma Brother    Rheum arthritis Brother    Endometrial cancer Mother    Anxiety disorder Mother    Hearing loss Father    Hypertension Father    Kidney Stones Father    Breast cancer Maternal Grandmother    Alzheimer's disease Maternal Grandmother    Hypertension Maternal Grandmother    Early death Maternal Grandfather    Macular degeneration Paternal Grandmother    Hypertension Paternal Grandmother    Arthritis Paternal Grandmother    Kidney Stones Paternal Grandmother    Heart disease Paternal Grandfather    Kidney disease Paternal Grandfather    Hirschsprung's disease Neg Hx    GER disease Neg Hx    Social History   Socioeconomic History   Marital status: Single    Spouse name: Not on file    Number of children: Not on file   Years of education: Not on file   Highest education level: Not on file  Occupational History   Not on file  Tobacco Use   Smoking status: Never    Passive exposure: Never   Smokeless tobacco: Never  Substance and Sexual Activity   Alcohol use: Never   Drug use: Never   Sexual activity: Never  Other Topics Concern   Not on file  Social History Narrative   Lives with with mom, dad, and her brother Ree Kida   She will start 12th grade in the fall at [employer].    Social Determinants of Health   Financial Resource Strain: Not on file  Food Insecurity: Not on file  Transportation Needs: Not on file  Physical Activity: Not on file  Stress: Not on file  Social Connections: Not on file     Review of Systems  Constitutional:  Negative for appetite change and unexpected weight change.  HENT:  Negative for congestion and sinus pressure.   Respiratory:  Negative for cough, chest tightness and shortness of breath.   Cardiovascular:  Negative for chest pain and palpitations.  Gastrointestinal:  Negative for abdominal pain, diarrhea, nausea and vomiting.  Genitourinary:  Negative for difficulty urinating and dysuria.  Musculoskeletal:  Negative for joint swelling and myalgias.  Skin:  Negative for color change and rash.  Neurological:  Negative for dizziness and headaches.  Psychiatric/Behavioral:  Negative for agitation and dysphoric mood.        Increased anxiety and stress.        Objective:     BP 116/72   Pulse 88   Temp 98 F (36.7 C)   Resp 16   Ht 5\' 4"  (1.626 m)   Wt 213 lb 9.6 oz (96.9 kg)   SpO2 98%   BMI 36.66 kg/m  Wt Readings from Last 3 Encounters:  02/12/23 213 lb 9.6 oz (96.9 kg)  10/21/22 213 lb (96.6 kg)  08/07/22 211 lb (95.7 kg)    Physical Exam Vitals reviewed.  Constitutional:      General: She is not in acute distress.    Appearance: Normal appearance.  HENT:     Head: Normocephalic and  atraumatic.     Right Ear: External ear normal.     Left Ear: External ear normal.  Eyes:     General: No scleral icterus.       Right eye: No discharge.        Left eye: No discharge.     Conjunctiva/sclera: Conjunctivae normal.  Neck:     Thyroid: No thyromegaly.  Cardiovascular:     Rate and Rhythm: Normal rate and regular rhythm.  Pulmonary:     Effort: No respiratory distress.     Breath sounds: Normal breath sounds. No wheezing.  Abdominal:     General: Bowel sounds are normal.     Palpations: Abdomen is soft.     Tenderness: There is no abdominal tenderness.  Musculoskeletal:        General: No swelling or tenderness.     Cervical back: Neck supple. No tenderness.  Lymphadenopathy:     Cervical: No cervical adenopathy.  Skin:    Findings: No erythema or rash.  Neurological:     Mental Status: She is alert.  Psychiatric:        Mood and Affect: Mood normal.        Behavior: Behavior normal.      Outpatient Encounter Medications as of 02/12/2023  Medication Sig   buPROPion ER (WELLBUTRIN SR) 100 MG 12 hr tablet Take 100 mg by mouth 2 (two) times daily.   FLUoxetine (PROZAC) 20 MG capsule Take 20 mg by mouth every morning.   linaclotide (LINZESS) 290 MCG CAPS capsule Take 1 capsule (290 mcg total) by mouth daily.   triamcinolone cream (KENALOG) 0.1 % Apply topically 2 (two) times daily as needed.   [DISCONTINUED] DULoxetine (CYMBALTA) 30 MG capsule Take 30 mg by mouth daily.   No facility-administered encounter medications on file as of 02/12/2023.     Lab Results  Component Value Date   WBC 3.1 (L) 04/13/2019   HGB 14.0 04/13/2019   HCT 40.3 04/13/2019   PLT 223 04/13/2019   GLUCOSE 116 (H) 04/13/2019   ALT 14 04/13/2019   AST 17 04/13/2019   NA 139 04/13/2019   K 3.9 04/13/2019   CL 104 04/13/2019   CREATININE 0.77 04/13/2019   BUN 8 04/13/2019   CO2 25 04/13/2019    DG Chest Portable 1 View  Result Date: 04/13/2019 CLINICAL DATA:  22 year old  COVID-19 positive patient with fatigue, cough, chest congestion and shortness of breath. EXAM: PORTABLE CHEST 1 VIEW COMPARISON:  None. FINDINGS: Cardiac silhouette and mediastinal contours normal in appearance for the AP portable technique. Pulmonary parenchyma clear. Bronchovascular  markings normal. Pulmonary vascularity normal. No pneumothorax. No visible pleural effusions. IMPRESSION: No acute cardiopulmonary disease. Electronically Signed   By: Hulan Saas M.D.   On: 04/13/2019 11:27       Assessment & Plan:  Screening cholesterol level -     Lipid panel; Future -     Lipid panel; Future  Urinary retention Assessment & Plan: Saw Dr Richardo Hanks 10/2022 - urine ok.  PVR = 0. Discussed pelvic floor dysfunction.  Plan PFT.  Planning to see urogyn.    Change in bowel movement Assessment & Plan: Issues with constipation. On linzess. Dose being adjusted.  Now taking every other day. With some loose stool on occasions. Mother concerned regarding autoimmune issues.  Had questions about celiac. Check routine labs and inflammatory markers.    Orders: -     CBC with Differential/Platelet; Future -     Comprehensive metabolic panel; Future -     TSH; Future -     Sedimentation rate; Future -     ANA; Future -     C-reactive protein; Future  Anxiety Assessment & Plan: Currently on prozac - lower dose.  Has plans to f/u with Dr Maryruth Bun to discuss medication changes.    Chronic constipation Assessment & Plan: Saw GI.  Taking Linzess every other day now. Continue f/u with GI.  Planning for colonoscopy and EGD.       Dale Hamlin, MD

## 2023-02-13 ENCOUNTER — Encounter: Payer: Self-pay | Admitting: Internal Medicine

## 2023-02-13 DIAGNOSIS — R198 Other specified symptoms and signs involving the digestive system and abdomen: Secondary | ICD-10-CM | POA: Insufficient documentation

## 2023-02-13 NOTE — Assessment & Plan Note (Signed)
Saw GI.  Taking Linzess every other day now. Continue f/u with GI.  Planning for colonoscopy and EGD.

## 2023-02-13 NOTE — Assessment & Plan Note (Signed)
Issues with constipation. On linzess. Dose being adjusted.  Now taking every other day. With some loose stool on occasions. Mother concerned regarding autoimmune issues.  Had questions about celiac. Check routine labs and inflammatory markers.

## 2023-02-13 NOTE — Assessment & Plan Note (Signed)
Currently on prozac - lower dose.  Has plans to f/u with Dr Maryruth Bun to discuss medication changes.

## 2023-02-13 NOTE — Assessment & Plan Note (Signed)
Saw Dr Richardo Hanks 10/2022 - urine ok.  PVR = 0. Discussed pelvic floor dysfunction.  Plan PFT.  Planning to see urogyn.

## 2023-02-19 ENCOUNTER — Other Ambulatory Visit (INDEPENDENT_AMBULATORY_CARE_PROVIDER_SITE_OTHER): Payer: 59

## 2023-02-19 DIAGNOSIS — R198 Other specified symptoms and signs involving the digestive system and abdomen: Secondary | ICD-10-CM

## 2023-02-19 DIAGNOSIS — F429 Obsessive-compulsive disorder, unspecified: Secondary | ICD-10-CM | POA: Diagnosis not present

## 2023-02-19 DIAGNOSIS — F411 Generalized anxiety disorder: Secondary | ICD-10-CM | POA: Diagnosis not present

## 2023-02-19 DIAGNOSIS — Z13220 Encounter for screening for lipoid disorders: Secondary | ICD-10-CM

## 2023-02-19 DIAGNOSIS — F325 Major depressive disorder, single episode, in full remission: Secondary | ICD-10-CM | POA: Diagnosis not present

## 2023-02-19 LAB — COMPREHENSIVE METABOLIC PANEL
ALT: 13 U/L (ref 0–35)
AST: 14 U/L (ref 0–37)
Albumin: 4.4 g/dL (ref 3.5–5.2)
Alkaline Phosphatase: 78 U/L (ref 39–117)
BUN: 7 mg/dL (ref 6–23)
CO2: 26 meq/L (ref 19–32)
Calcium: 9.5 mg/dL (ref 8.4–10.5)
Chloride: 105 meq/L (ref 96–112)
Creatinine, Ser: 0.77 mg/dL (ref 0.40–1.20)
GFR: 110.37 mL/min (ref >60.00)
Glucose, Bld: 92 mg/dL (ref 70–99)
Potassium: 4.1 meq/L (ref 3.5–5.1)
Sodium: 138 meq/L (ref 135–145)
Total Bilirubin: 0.5 mg/dL (ref 0.2–1.2)
Total Protein: 6.7 g/dL (ref 6.0–8.3)

## 2023-02-19 LAB — CBC WITH DIFFERENTIAL/PLATELET
Basophils Absolute: 0.1 10*3/uL (ref 0.0–0.1)
Basophils Relative: 1.3 % (ref 0.0–3.0)
Eosinophils Absolute: 0.4 10*3/uL (ref 0.0–0.7)
Eosinophils Relative: 5.2 % — ABNORMAL HIGH (ref 0.0–5.0)
HCT: 39.5 % (ref 36.0–46.0)
Hemoglobin: 13.1 g/dL (ref 12.0–15.0)
Lymphocytes Relative: 28.7 % (ref 12.0–46.0)
Lymphs Abs: 2.1 10*3/uL (ref 0.7–4.0)
MCHC: 33.1 g/dL (ref 30.0–36.0)
MCV: 85.9 fL (ref 78.0–100.0)
Monocytes Absolute: 0.4 10*3/uL (ref 0.1–1.0)
Monocytes Relative: 5.6 % (ref 3.0–12.0)
Neutro Abs: 4.4 10*3/uL (ref 1.4–7.7)
Neutrophils Relative %: 59.2 % (ref 43.0–77.0)
Platelets: 380 10*3/uL (ref 150.0–400.0)
RBC: 4.6 Mil/uL (ref 3.87–5.11)
RDW: 13.1 % (ref 11.5–15.5)
WBC: 7.4 10*3/uL (ref 4.0–10.5)

## 2023-02-19 LAB — SEDIMENTATION RATE: Sed Rate: 14 mm/h (ref 0–20)

## 2023-02-19 LAB — LIPID PANEL
Cholesterol: 140 mg/dL (ref 0–200)
HDL: 47 mg/dL (ref >39.00)
LDL Cholesterol: 79 mg/dL (ref 0–99)
NonHDL: 93.11
Total CHOL/HDL Ratio: 3
Triglycerides: 70 mg/dL (ref 0.0–149.0)
VLDL: 14 mg/dL (ref 0.0–40.0)

## 2023-02-19 LAB — TSH: TSH: 1.35 u[IU]/mL (ref 0.35–5.50)

## 2023-02-19 LAB — C-REACTIVE PROTEIN: CRP: 1 mg/dL (ref 0.5–20.0)

## 2023-02-21 LAB — ANTI-NUCLEAR AB-TITER (ANA TITER): ANA Titer 1: 1:40 {titer} — ABNORMAL HIGH

## 2023-02-21 LAB — ANA: Anti Nuclear Antibody (ANA): POSITIVE — AB

## 2023-02-23 ENCOUNTER — Telehealth: Payer: Self-pay

## 2023-02-23 NOTE — Telephone Encounter (Signed)
-----   Message from [place] sent at 02/22/2023  6:10 AM EST ----- See me before calling. Mother had questions at visit about autoimmune disorders, etc.  Had requested labs.  Her inflammatory markers were negative.  ANA is positive - low titer.  This is very non specific. I had discussed this with them prior to lab draw.  If desires, can refer to rheumatology for further evaluation.

## 2023-02-24 ENCOUNTER — Telehealth: Payer: Self-pay

## 2023-02-24 NOTE — Telephone Encounter (Signed)
-----   Message from [place] sent at 02/22/2023  6:10 AM EST ----- See me before calling. Mother had questions at visit about autoimmune disorders, etc.  Had requested labs.  Her inflammatory markers were negative.  ANA is positive - low titer.  This is very non specific. I had discussed this with them prior to lab draw.  If desires, can refer to rheumatology for further evaluation.

## 2023-02-26 DIAGNOSIS — F429 Obsessive-compulsive disorder, unspecified: Secondary | ICD-10-CM | POA: Diagnosis not present

## 2023-02-26 DIAGNOSIS — F330 Major depressive disorder, recurrent, mild: Secondary | ICD-10-CM | POA: Diagnosis not present

## 2023-03-09 NOTE — Telephone Encounter (Signed)
Error

## 2023-03-12 DIAGNOSIS — F330 Major depressive disorder, recurrent, mild: Secondary | ICD-10-CM | POA: Diagnosis not present

## 2023-03-12 DIAGNOSIS — F429 Obsessive-compulsive disorder, unspecified: Secondary | ICD-10-CM | POA: Diagnosis not present

## 2023-03-17 DIAGNOSIS — N398 Other specified disorders of urinary system: Secondary | ICD-10-CM | POA: Diagnosis not present

## 2023-03-17 DIAGNOSIS — M6289 Other specified disorders of muscle: Secondary | ICD-10-CM | POA: Diagnosis not present

## 2023-03-30 ENCOUNTER — Encounter: Payer: Self-pay | Admitting: Internal Medicine

## 2023-04-02 DIAGNOSIS — F330 Major depressive disorder, recurrent, mild: Secondary | ICD-10-CM | POA: Diagnosis not present

## 2023-04-02 DIAGNOSIS — F429 Obsessive-compulsive disorder, unspecified: Secondary | ICD-10-CM | POA: Diagnosis not present

## 2023-04-05 DIAGNOSIS — F325 Major depressive disorder, single episode, in full remission: Secondary | ICD-10-CM | POA: Diagnosis not present

## 2023-04-05 DIAGNOSIS — F411 Generalized anxiety disorder: Secondary | ICD-10-CM | POA: Diagnosis not present

## 2023-04-05 DIAGNOSIS — F429 Obsessive-compulsive disorder, unspecified: Secondary | ICD-10-CM | POA: Diagnosis not present

## 2023-06-11 DIAGNOSIS — F330 Major depressive disorder, recurrent, mild: Secondary | ICD-10-CM | POA: Diagnosis not present

## 2023-06-11 DIAGNOSIS — F429 Obsessive-compulsive disorder, unspecified: Secondary | ICD-10-CM | POA: Diagnosis not present

## 2023-07-13 ENCOUNTER — Encounter: Payer: Self-pay | Admitting: Internal Medicine

## 2023-07-20 ENCOUNTER — Encounter: Payer: Self-pay | Admitting: Internal Medicine

## 2023-07-21 ENCOUNTER — Ambulatory Visit
Admission: RE | Admit: 2023-07-21 | Discharge: 2023-07-21 | Disposition: A | Discharge status: Home / Self Care | Payer: Self-pay | Attending: Internal Medicine | Admitting: Internal Medicine

## 2023-07-21 ENCOUNTER — Ambulatory Visit: Admitting: Anesthesiology

## 2023-07-21 ENCOUNTER — Encounter: Payer: Self-pay | Admitting: Internal Medicine

## 2023-07-21 ENCOUNTER — Other Ambulatory Visit: Payer: Self-pay

## 2023-07-21 ENCOUNTER — Encounter
Admission: RE | Disposition: A | Discharge status: Home / Self Care | Payer: Self-pay | Source: Home / Self Care | Attending: Internal Medicine

## 2023-07-21 DIAGNOSIS — K219 Gastro-esophageal reflux disease without esophagitis: Secondary | ICD-10-CM | POA: Insufficient documentation

## 2023-07-21 DIAGNOSIS — K30 Functional dyspepsia: Secondary | ICD-10-CM | POA: Insufficient documentation

## 2023-07-21 DIAGNOSIS — K2970 Gastritis, unspecified, without bleeding: Secondary | ICD-10-CM | POA: Insufficient documentation

## 2023-07-21 DIAGNOSIS — K581 Irritable bowel syndrome with constipation: Secondary | ICD-10-CM | POA: Insufficient documentation

## 2023-07-21 DIAGNOSIS — Z79899 Other long term (current) drug therapy: Secondary | ICD-10-CM | POA: Insufficient documentation

## 2023-07-21 DIAGNOSIS — R194 Change in bowel habit: Secondary | ICD-10-CM | POA: Diagnosis not present

## 2023-07-21 DIAGNOSIS — K2960 Other gastritis without bleeding: Secondary | ICD-10-CM | POA: Diagnosis not present

## 2023-07-21 DIAGNOSIS — K5909 Other constipation: Secondary | ICD-10-CM | POA: Diagnosis not present

## 2023-07-21 DIAGNOSIS — K2950 Unspecified chronic gastritis without bleeding: Secondary | ICD-10-CM | POA: Diagnosis not present

## 2023-07-21 HISTORY — DX: Depression, unspecified: F32.A

## 2023-07-21 HISTORY — PX: COLONOSCOPY WITH PROPOFOL: SHX5780

## 2023-07-21 HISTORY — DX: Gastro-esophageal reflux disease without esophagitis: K21.9

## 2023-07-21 HISTORY — PX: ESOPHAGOGASTRODUODENOSCOPY (EGD) WITH PROPOFOL: SHX5813

## 2023-07-21 HISTORY — DX: Epigastric pain: R10.13

## 2023-07-21 LAB — POCT PREGNANCY, URINE: Preg Test, Ur: NEGATIVE

## 2023-07-21 SURGERY — COLONOSCOPY WITH PROPOFOL
Anesthesia: General

## 2023-07-21 MED ORDER — PROPOFOL 10 MG/ML IV BOLUS
INTRAVENOUS | Status: DC | PRN
  Administered 2023-07-21: 50 mg via INTRAVENOUS
  Administered 2023-07-21: 30 mg via INTRAVENOUS
  Administered 2023-07-21: 50 mg via INTRAVENOUS

## 2023-07-21 MED ORDER — SODIUM CHLORIDE 0.9 % IV SOLN: INTRAVENOUS | Status: DC

## 2023-07-21 MED ORDER — DEXMEDETOMIDINE HCL IN NACL 80 MCG/20ML IV SOLN
INTRAVENOUS | Status: DC | PRN
  Administered 2023-07-21 (×2): 20 ug via INTRAVENOUS

## 2023-07-21 MED ORDER — LIDOCAINE HCL (CARDIAC) PF 100 MG/5ML IV SOSY
PREFILLED_SYRINGE | INTRAVENOUS | Status: DC | PRN
  Administered 2023-07-21: 80 mg via INTRAVENOUS

## 2023-07-21 MED ORDER — PROPOFOL 500 MG/50ML IV EMUL
INTRAVENOUS | Status: DC | PRN
  Administered 2023-07-21: 125 ug/kg/min via INTRAVENOUS

## 2023-07-21 MED ORDER — PROPOFOL 1000 MG/100ML IV EMUL
INTRAVENOUS | Status: AC
  Filled 2023-07-21: qty 100

## 2023-07-21 MED ORDER — GLYCOPYRROLATE 0.2 MG/ML IJ SOLN
INTRAMUSCULAR | Status: DC | PRN
  Administered 2023-07-21: .2 mg via INTRAVENOUS

## 2023-07-21 MED ORDER — GLYCOPYRROLATE 0.2 MG/ML IJ SOLN
INTRAMUSCULAR | Status: AC
  Filled 2023-07-21: qty 1

## 2023-07-21 NOTE — Anesthesia Preprocedure Evaluation (Signed)
 Anesthesia Evaluation  Patient identified by MRN, date of birth, ID band Patient awake    Reviewed: Allergy & Precautions, NPO status , Patient's Chart, lab work & pertinent test results  History of Anesthesia Complications Negative for: history of anesthetic complications  Airway Mallampati: II  TM Distance: >3 FB Neck ROM: Full    Dental no notable dental hx. (+) Teeth Intact   Pulmonary neg pulmonary ROS, neg sleep apnea, neg COPD, Patient abstained from smoking.Not current smoker   Pulmonary exam normal breath sounds clear to auscultation       Cardiovascular Exercise Tolerance: Good METS(-) hypertension(-) CAD and (-) Past MI negative cardio ROS (-) dysrhythmias  Rhythm:Regular Rate:Normal - Systolic murmurs    Neuro/Psych  PSYCHIATRIC DISORDERS Anxiety Depression    negative neurological ROS     GI/Hepatic ,GERD  ,,(+)     (-) substance abuse    Endo/Other  neg diabetes    Renal/GU negative Renal ROS     Musculoskeletal   Abdominal  (+) + obese  Peds  Hematology   Anesthesia Other Findings Past Medical History: No date: Abdominal pain No date: Anxiety     Comment:  Phreesia 10/17/2019 No date: Constipation No date: Depression No date: Dyspepsia No date: GERD (gastroesophageal reflux disease) No date: IBS (irritable bowel syndrome) No date: Reflux 2001-06-18: Viral meningitis  Reproductive/Obstetrics                             Anesthesia Physical Anesthesia Plan  ASA: 2  Anesthesia Plan: General   Post-op Pain Management: Minimal or no pain anticipated   Induction: Intravenous  PONV Risk Score and Plan: 3 and Propofol infusion, TIVA and Ondansetron  Airway Management Planned: Nasal Cannula  Additional Equipment: None  Intra-op Plan:   Post-operative Plan:   Informed Consent: I have reviewed the patients History and Physical, chart, labs and discussed the  procedure including the risks, benefits and alternatives for the proposed anesthesia with the patient or authorized representative who has indicated his/her understanding and acceptance.     Dental advisory given  Plan Discussed with: CRNA and Surgeon  Anesthesia Plan Comments: (Discussed risks of anesthesia with patient, including possibility of difficulty with spontaneous ventilation under anesthesia necessitating airway intervention, PONV, and rare risks such as cardiac or respiratory or neurological events, and allergic reactions. Discussed the role of CRNA in patient's perioperative care. Patient understands.)       Anesthesia Quick Evaluation

## 2023-07-21 NOTE — Op Note (Signed)
 [facility] Patient Name: Diana [name] Procedure Date: 07/21/2023 12:01 PM MRN: [id number] Account #: [id number] Date of Birth: 01/26/2001 Admit Type: Outpatient Age: 23 Room: [facility] ENDO ROOM 2 Gender: Female Note Status: Finalized Instrument Name: Prentice Docker [id number] Procedure:             Colonoscopy Indications:           Change in bowel habits, Incidental constipation noted Providers:             Royce Macadamia K. Norma Fredrickson MD, MD Referring MD:          Dale Retsof, MD (Referring MD) Medicines:             Propofol per Anesthesia Complications:         No immediate complications. Procedure:             Pre-Anesthesia Assessment:                        - The risks and benefits of the procedure and the                         sedation options and risks were discussed with the                         patient. All questions were answered and informed                         consent was obtained.                        - Patient identification and proposed procedure were                         verified prior to the procedure by the nurse. The                         procedure was verified in the procedure room.                        - ASA Grade Assessment: II - A patient with mild                         systemic disease.                        - After reviewing the risks and benefits, the patient                         was deemed in satisfactory condition to undergo the                         procedure.                        After obtaining informed consent, the colonoscope was                         passed under direct vision. Throughout the procedure,  the patient's blood pressure, pulse, and oxygen                         saturations were monitored continuously. The                         Colonoscope was introduced through the anus and                         advanced to the the terminal ileum, with                          identification of the appendiceal orifice and IC                         valve. The colonoscopy was performed without                         difficulty. The patient tolerated the procedure well.                         The quality of the bowel preparation was good. The                         terminal ileum, ileocecal valve, appendiceal orifice,                         and rectum were photographed. Findings:      The perianal and digital rectal examinations were normal. Pertinent       negatives include normal sphincter tone and no palpable rectal lesions.      The terminal ileum appeared normal.      The entire examined colon appeared normal on direct and retroflexion       views. Impression:            - The examined portion of the ileum was normal.                        - The entire examined colon is normal on direct and                         retroflexion views.                        - No specimens collected. Recommendation:        - Await pathology results from EGD, also performed                         today.                        - Patient has a contact number available for                         emergencies. The signs and symptoms of potential                         delayed complications were discussed with the patient.  Return to normal activities tomorrow. Written                         discharge instructions were provided to the patient.                        - Resume previous diet.                        - Continue present medications.                        - No recommendation at this time regarding repeat                         colonoscopy due to young age.                        - Return to my office in 4 months.                        - Telephone GI office to schedule appointment.                        - The findings and recommendations were discussed with                         the patient and their family. Procedure Code(s):      --- Professional ---                        [phone], Colonoscopy, flexible; diagnostic, including                         collection of specimen(s) by brushing or washing, when                         performed (separate procedure) Diagnosis Code(s):     --- Professional ---                        R19.4, Change in bowel habit CPT copyright 2022 American Medical Association. All rights reserved. The codes documented in this report are preliminary and upon coder review may  be revised to meet current compliance requirements. Stanton Kidney MD, MD 07/21/2023 12:34:01 PM This report has been signed electronically. Number of Addenda: 0 Note Initiated On: 07/21/2023 12:01 PM Scope Withdrawal Time: 0 hours 6 minutes 2 seconds  Total Procedure Duration: 0 hours 8 minutes 39 seconds  Estimated Blood Loss:  Estimated blood loss: none.      [facility]

## 2023-07-21 NOTE — Op Note (Signed)
 [facility]enter[facility] Patient Name: Diana [name] Procedure Date: 07/21/2023 12:02 PM MRN: [id number] Account #: [id number] Date of Birth: 2001-03-05 Admit Type: Outpatient Age: 23 Room: [facility] ENDO ROOM 2 Gender: Female Note Status: Finalized Instrument Name: Patton Salles Endoscope [id number] Procedure:             Upper GI endoscopy Indications:           Functional Dyspepsia, Suspected esophageal reflux Providers:             Boykin Nearing. Norma Fredrickson MD, MD Referring MD:          Dale [place] Davis, MD (Referring MD) Medicines:             Propofol per Anesthesia Complications:         No immediate complications. Estimated blood loss:                         Minimal. Procedure:             Pre-Anesthesia Assessment:                        - The risks and benefits of the procedure and the                         sedation options and risks were discussed with the                         patient. All questions were answered and informed                         consent was obtained.                        - Patient identification and proposed procedure were                         verified prior to the procedure by the nurse. The                         procedure was verified in the procedure room.                        - ASA Grade Assessment: II - A patient with mild                         systemic disease.                        - After reviewing the risks and benefits, the patient                         was deemed in satisfactory condition to undergo the                         procedure.                        After obtaining informed consent, the endoscope was                         passed  under direct vision. Throughout the procedure,                         the patient's blood pressure, pulse, and oxygen                         saturations were monitored continuously. The Endoscope                         was introduced through the mouth, and advanced to the                          third part of duodenum. The upper GI endoscopy was                         accomplished without difficulty. The patient tolerated                         the procedure well. Findings:      The esophagus was normal.      Patchy minimal inflammation characterized by erythema was found in the       gastric antrum. Biopsies were taken with a cold forceps for Helicobacter       pylori testing.      The cardia and gastric fundus were normal on retroflexion.      The examined duodenum was normal. Impression:            - Normal esophagus.                        - Gastritis. Biopsied.                        - Normal examined duodenum. Recommendation:        - Await pathology results.                        - Proceed with colonoscopy Procedure Code(s):     --- Professional ---                        [phone], Esophagogastroduodenoscopy, flexible,                         transoral; with biopsy, single or multiple Diagnosis Code(s):     --- Professional ---                        K30, Functional dyspepsia                        K29.70, Gastritis, unspecified, without bleeding CPT copyright 2022 American Medical Association. All rights reserved. The codes documented in this report are preliminary and upon coder review may  be revised to meet current compliance requirements. Stanton Kidney MD, MD 07/21/2023 12:18:37 PM This report has been signed electronically. Number of Addenda: 0 Note Initiated On: 07/21/2023 12:02 PM Estimated Blood Loss:  Estimated blood loss was minimal.      [facility]

## 2023-07-21 NOTE — Interval H&P Note (Signed)
 History and Physical Interval Note:  07/21/2023 11:40 AM  Diana [name]  has presented today for surgery, with the diagnosis of 530.81 (ICD-9-CM) - K21.9 (ICD-10-CM) - Gastroesophageal reflux disease, unspecified whether esophagitis present 564.00 (ICD-9-CM) - K59.09 (ICD-10-CM) - Chronic constipation 536.8 (ICD-9-CM) - R10.13 (ICD-10-CM) - Dyspepsia 787.99 (ICD-9-CM) - R19.4 (ICD-10-CM) - Change in bowel habits.  The various methods of treatment have been discussed with the patient and family. After consideration of risks, benefits and other options for treatment, the patient has consented to  Procedure(s): COLONOSCOPY WITH PROPOFOL (N/A) ESOPHAGOGASTRODUODENOSCOPY (EGD) WITH PROPOFOL (N/A) as a surgical intervention.  The patient's history has been reviewed, patient examined, no change in status, stable for surgery.  I have reviewed the patient's chart and labs.  Questions were answered to the patient's satisfaction.     [place], [place]

## 2023-07-21 NOTE — H&P (Signed)
 Outpatient short stay form Pre-procedure 07/21/2023 10:47 AM Diana [name], M.D.  Primary Physician: Diana [name] , M.D.  Reason for visit:  GERD, change in bowel habits  History of present illness:  [name] presents today for clinical follow-up of abdominal pain, irritable bowel syndrome with constipation. Patient says that she has had some "looser stools" of a watery consistency once a day to every other day while taking the Linzess 145 mcg daily. She does notice a great benefit of the medication to her abdominal pain which is much less. No rectal bleeding. She would like to ask about any changes in dosages that are necessary. Patient continues to have some mild dyspepsia and does have a history of heartburn intermittently but does not take any antacids. No dysphagia. No involuntary weight loss. Patient advised to continue the same dosage of medication but stagger to every other day dosing. Patient was alerted that this could cause some return to constipation and if so patient is to resume daily administration of Linzess 145 mcg. Plan EGD and colonoscopy.The patient understands the nature of the planned procedure, indications, risks, alternatives and potential complications, including but not limited to bleeding, perforation, infection, damage to internal organs and oversedation. The patient wishes to proceed.    No current facility-administered medications for this encounter.  Current Outpatient Medications:    clonazePAM (KLONOPIN) 0.5 MG tablet, Take 0.5 mg by mouth 2 (two) times daily as needed for anxiety., Disp: , Rfl:    lactulose (CEPHULAC) 10 g packet, Take 10 g by mouth 3 (three) times daily., Disp: , Rfl:    buPROPion ER (WELLBUTRIN SR) 100 MG 12 hr tablet, Take 100 mg by mouth 2 (two) times daily., Disp: , Rfl:    FLUoxetine (PROZAC) 20 MG capsule, Take 20 mg by mouth every morning., Disp: , Rfl:    linaclotide (LINZESS) 290 MCG CAPS capsule, Take 1 capsule (290 mcg total)  by mouth daily., Disp: 30 capsule, Rfl: 5   triamcinolone cream (KENALOG) 0.1 %, Apply topically 2 (two) times daily as needed., Disp: 30 g, Rfl: 0  No medications prior to admission.     No Known Allergies   Past Medical History:  Diagnosis Date   Abdominal pain    Anxiety    Phreesia 10/17/2019   Constipation    Depression    Dyspepsia    GERD (gastroesophageal reflux disease)    IBS (irritable bowel syndrome)    Reflux    Viral meningitis December 08, 2001    Review of systems:  Otherwise negative.    Physical Exam  Gen: Alert, oriented. Appears stated age.  HEENT: [place]/AT. PERRLA. Lungs: CTA, no wheezes. CV: RR nl S1, S2. Abd: soft, benign, no masses. BS+ Ext: No edema. Pulses 2+    Planned procedures: Proceed with EGD and colonoscopy. The patient understands the nature of the planned procedure, indications, risks, alternatives and potential complications including but not limited to bleeding, infection, perforation, damage to internal organs and possible oversedation/side effects from anesthesia. The patient agrees and gives consent to proceed.  Please refer to procedure notes for findings, recommendations and patient disposition/instructions.     Diana [name] K. Diana [name], M.D. Gastroenterology 07/21/2023  10:47 AM

## 2023-07-21 NOTE — Transfer of Care (Signed)
 Immediate Anesthesia Transfer of Care Note  Patient: Diana [name]  Procedure(s) Performed: COLONOSCOPY WITH PROPOFOL ESOPHAGOGASTRODUODENOSCOPY (EGD) WITH PROPOFOL  Patient Location: PACU  Anesthesia Type:General  Level of Consciousness: sedated  Airway & Oxygen Therapy: Patient Spontanous Breathing  Post-op Assessment: Report given to RN and Post -op Vital signs reviewed and stable  Post vital signs: Reviewed and stable  Last Vitals:  Vitals Value Taken Time  BP    Temp    Pulse    Resp 17 07/21/23 1232  SpO2    Vitals shown include unfiled device data.  Last Pain:  Vitals:   07/21/23 1116  TempSrc: Temporal  PainSc: 0-No pain         Complications: No notable events documented.

## 2023-07-21 NOTE — Anesthesia Postprocedure Evaluation (Signed)
 Anesthesia Post Note  Patient: Diana [name]  Procedure(s) Performed: COLONOSCOPY WITH PROPOFOL ESOPHAGOGASTRODUODENOSCOPY (EGD) WITH PROPOFOL  Patient location during evaluation: Endoscopy Anesthesia Type: General Level of consciousness: awake and alert Pain management: pain level controlled Vital Signs Assessment: post-procedure vital signs reviewed and stable Respiratory status: spontaneous breathing, nonlabored ventilation, respiratory function stable and patient connected to nasal cannula oxygen Cardiovascular status: blood pressure returned to baseline and stable Postop Assessment: no apparent nausea or vomiting Anesthetic complications: no   No notable events documented.   Last Vitals:  Vitals:   07/21/23 1319 07/21/23 1325  BP: 93/62 (!) 80/62  Pulse: 64 66  Resp: 17 19  Temp:    SpO2: 100% 97%    Last Pain:  Vitals:   07/21/23 1303  TempSrc:   PainSc: 0-No pain                 Corinda Gubler

## 2023-07-22 ENCOUNTER — Encounter: Payer: Self-pay | Admitting: Internal Medicine

## 2023-07-22 LAB — SURGICAL PATHOLOGY

## 2023-07-30 ENCOUNTER — Other Ambulatory Visit: Payer: Self-pay | Admitting: Internal Medicine

## 2023-07-30 DIAGNOSIS — N3943 Post-void dribbling: Secondary | ICD-10-CM | POA: Diagnosis not present

## 2023-07-30 DIAGNOSIS — R1084 Generalized abdominal pain: Secondary | ICD-10-CM | POA: Diagnosis not present

## 2023-07-30 DIAGNOSIS — K5909 Other constipation: Secondary | ICD-10-CM | POA: Diagnosis not present

## 2023-07-30 DIAGNOSIS — M62838 Other muscle spasm: Secondary | ICD-10-CM | POA: Diagnosis not present

## 2023-07-30 DIAGNOSIS — R3914 Feeling of incomplete bladder emptying: Secondary | ICD-10-CM | POA: Diagnosis not present

## 2023-07-30 DIAGNOSIS — R3915 Urgency of urination: Secondary | ICD-10-CM | POA: Diagnosis not present

## 2023-07-30 DIAGNOSIS — M6289 Other specified disorders of muscle: Secondary | ICD-10-CM | POA: Diagnosis not present

## 2023-08-02 ENCOUNTER — Ambulatory Visit
Admission: RE | Admit: 2023-08-02 | Discharge: 2023-08-02 | Disposition: A | Discharge status: Home / Self Care | Source: Ambulatory Visit | Attending: Internal Medicine | Admitting: Internal Medicine

## 2023-08-02 DIAGNOSIS — R1084 Generalized abdominal pain: Secondary | ICD-10-CM

## 2023-08-02 DIAGNOSIS — R109 Unspecified abdominal pain: Secondary | ICD-10-CM | POA: Diagnosis not present

## 2023-08-13 ENCOUNTER — Ambulatory Visit: Payer: 59 | Admitting: Internal Medicine

## 2023-08-13 ENCOUNTER — Encounter: Payer: Self-pay | Admitting: Internal Medicine

## 2023-08-13 VITALS — BP 116/70 | HR 75 | Temp 98.0°F | Resp 16 | Ht 64.0 in | Wt 210.0 lb

## 2023-08-13 DIAGNOSIS — R198 Other specified symptoms and signs involving the digestive system and abdomen: Secondary | ICD-10-CM | POA: Diagnosis not present

## 2023-08-13 DIAGNOSIS — M62838 Other muscle spasm: Secondary | ICD-10-CM | POA: Diagnosis not present

## 2023-08-13 DIAGNOSIS — R339 Retention of urine, unspecified: Secondary | ICD-10-CM

## 2023-08-13 DIAGNOSIS — R3914 Feeling of incomplete bladder emptying: Secondary | ICD-10-CM | POA: Diagnosis not present

## 2023-08-13 DIAGNOSIS — K5909 Other constipation: Secondary | ICD-10-CM

## 2023-08-13 DIAGNOSIS — R1084 Generalized abdominal pain: Secondary | ICD-10-CM | POA: Diagnosis not present

## 2023-08-13 DIAGNOSIS — M6289 Other specified disorders of muscle: Secondary | ICD-10-CM | POA: Diagnosis not present

## 2023-08-13 DIAGNOSIS — N3943 Post-void dribbling: Secondary | ICD-10-CM | POA: Diagnosis not present

## 2023-08-13 DIAGNOSIS — R3915 Urgency of urination: Secondary | ICD-10-CM | POA: Diagnosis not present

## 2023-08-13 DIAGNOSIS — F419 Anxiety disorder, unspecified: Secondary | ICD-10-CM | POA: Diagnosis not present

## 2023-08-13 NOTE — Progress Notes (Signed)
 Subjective:    Patient ID: Diana [name], female    DOB: 05/02/2000, 23 y.o.   MRN: [id number]  Patient here for  Chief Complaint  Patient presents with   Medical Management of Chronic Issues    HPI Here for a scheduled follow up - follow up regarding increased stress and GI issues. She is accompanied by her mother.  History obtained from both of them. Saw urogyn 02/2023 - high tone pelvic floor dysfunction. Recommended PFPT. Has just started. Was given exercise to do at home. Also seeing GI for persistent bowel issues. Is S/p colonoscopy 07/21/23- normal.  EGD 07/21/23 - gastritis. She is taking linzess  - every morning. Eating more fiber. Still with GI flares. Has found that eating out does seem to make things worse. Unsure of other triggers. Discussed possible allergy triggers - food allergies, etc. Discussed referral to allergist. She is trying to do better with her diet. Breathing stable.  Continues on prozac. Overall stable.    Past Medical History:  Diagnosis Date   Abdominal pain    Anxiety    Phreesia 10/17/2019   Constipation    Depression    Dyspepsia    GERD (gastroesophageal reflux disease)    IBS (irritable bowel syndrome)    Reflux    Viral meningitis 2001/07/26   Past Surgical History:  Procedure Laterality Date   COLONOSCOPY WITH PROPOFOL  N/A 07/21/2023   Procedure: COLONOSCOPY WITH PROPOFOL ;  Surgeon: Toledo, Alphonsus Jeans, MD;  Location: [facility] END[facility]COPY;  Service: Gastroenterology;  Laterality: N/A;   ESOPHAGOGASTRODUODENOSCOPY (EGD) WITH PROPOFOL  N/A 07/21/2023   Procedure: ESOPHAGOGASTRODUODENOSCOPY (EGD) WITH PROPOFOL ;  Surgeon: Toledo, Alphonsus Jeans, MD;  Location: [facility] ENDOSCOPY;  Service: Gastroenterology;  Laterality: N/A;   WISDOM TOOTH EXTRACTION     Family History  Problem Relation Age of Onset   Arthritis Brother    Asthma Brother    Rheum arthritis Brother    Endometrial cancer Mother    Anxiety disorder Mother    Hearing loss Father    Hypertension Father     Kidney Stones Father    Breast cancer Maternal Grandmother    Alzheimer's disease Maternal Grandmother    Hypertension Maternal Grandmother    Early death Maternal Grandfather    Macular degeneration Paternal Grandmother    Hypertension Paternal Grandmother    Arthritis Paternal Grandmother    Kidney Stones Paternal Grandmother    Heart disease Paternal Grandfather    Kidney disease Paternal Grandfather    Hirschsprung's disease Neg Hx    GER disease Neg Hx    Social History   Socioeconomic History   Marital status: Single    Spouse name: Not on file   Number of children: Not on file   Years of education: Not on file   Highest education level: Not on file  Occupational History   Not on file  Tobacco Use   Smoking status: Never    Passive exposure: Never   Smokeless tobacco: Never  Vaping Use   Vaping status: Never Used  Substance and Sexual Activity   Alcohol use: Not Currently   Drug use: Never   Sexual activity: Never  Other Topics Concern   Not on file  Social History Narrative   Lives with with mom, dad, and her brother Marylou Sobers   She will start 12th grade in the fall at [employer].    Social Drivers of Corporate investment banker Strain: Not on file  Food Insecurity: Not on file  Transportation  Needs: Not on file  Physical Activity: Not on file  Stress: Not on file  Social Connections: Not on file     Review of Systems  Constitutional:  Negative for appetite change and unexpected weight change.  HENT:  Negative for congestion and sinus pressure.   Respiratory:  Negative for cough, chest tightness and shortness of breath.   Cardiovascular:  Negative for chest pain, palpitations and leg swelling.  Gastrointestinal:  Negative for vomiting.       Bowel issues as outlined. Taking linzess  to help keep bowels moving.   Genitourinary:  Negative for difficulty urinating and dysuria.  Musculoskeletal:  Negative for joint swelling and myalgias.   Skin:  Negative for color change and rash.  Neurological:  Negative for dizziness and headaches.  Psychiatric/Behavioral:  Negative for agitation and dysphoric mood.        Objective:     BP 116/70   Pulse 75   Temp 98 F (36.7 C)   Resp 16   Ht 5\' 4"  (1.626 m)   Wt 210 lb (95.3 kg)   LMP 07/20/2023   SpO2 98%   BMI 36.05 kg/m  Wt Readings from Last 3 Encounters:  08/13/23 210 lb (95.3 kg)  02/12/23 213 lb 9.6 oz (96.9 kg)  10/21/22 213 lb (96.6 kg)    Physical Exam Vitals reviewed.  Constitutional:      General: She is not in acute distress.    Appearance: Normal appearance.  HENT:     Head: Normocephalic and atraumatic.     Right Ear: External ear normal.     Left Ear: External ear normal.     Mouth/Throat:     Pharynx: No oropharyngeal exudate or posterior oropharyngeal erythema.  Eyes:     General: No scleral icterus.       Right eye: No discharge.        Left eye: No discharge.     Conjunctiva/sclera: Conjunctivae normal.  Neck:     Thyroid: No thyromegaly.  Cardiovascular:     Rate and Rhythm: Normal rate and regular rhythm.  Pulmonary:     Effort: No respiratory distress.     Breath sounds: Normal breath sounds. No wheezing.  Abdominal:     General: Bowel sounds are normal.     Palpations: Abdomen is soft.     Tenderness: There is no abdominal tenderness.  Musculoskeletal:        General: No swelling or tenderness.     Cervical back: Neck supple. No tenderness.  Lymphadenopathy:     Cervical: No cervical adenopathy.  Skin:    Findings: No erythema or rash.  Neurological:     Mental Status: She is alert.  Psychiatric:        Mood and Affect: Mood normal.        Behavior: Behavior normal.         Outpatient Encounter Medications as of 08/13/2023  Medication Sig   FLUoxetine (PROZAC) 20 MG capsule Take 20 mg by mouth every morning.   linaclotide  (LINZESS ) 290 MCG CAPS capsule Take 1 capsule (290 mcg total) by mouth daily.   triamcinolone   cream (KENALOG ) 0.1 % Apply topically 2 (two) times daily as needed.   [DISCONTINUED] buPROPion  ER (WELLBUTRIN  SR) 100 MG 12 hr tablet Take 100 mg by mouth 2 (two) times daily.   [DISCONTINUED] clonazePAM (KLONOPIN) 0.5 MG tablet Take 0.5 mg by mouth 2 (two) times daily as needed for anxiety.   [DISCONTINUED] lactulose (CEPHULAC) 10 g  packet Take 10 g by mouth 3 (three) times daily.   No facility-administered encounter medications on file as of 08/13/2023.     Lab Results  Component Value Date   WBC 7.4 02/19/2023   HGB 13.1 02/19/2023   HCT 39.5 02/19/2023   PLT 380.0 02/19/2023   GLUCOSE 92 02/19/2023   CHOL 140 02/19/2023   TRIG 70.0 02/19/2023   HDL 47.00 02/19/2023   LDLCALC 79 02/19/2023   ALT 13 02/19/2023   AST 14 02/19/2023   NA 138 02/19/2023   K 4.1 02/19/2023   CL 105 02/19/2023   CREATININE 0.77 02/19/2023   BUN 7 02/19/2023   CO2 26 02/19/2023   TSH 1.35 02/19/2023    US  Abdomen Limited RUQ (LIVER/GB) Result Date: 08/02/2023 CLINICAL DATA:  Abdomen pain. EXAM: ULTRASOUND ABDOMEN LIMITED RIGHT UPPER QUADRANT COMPARISON:  None Available. FINDINGS: Gallbladder: No gallstones or wall thickening visualized. No sonographic Murphy sign noted by sonographer. Common bile duct: Diameter: 3.1 mm. Liver: No focal lesion. Increased echotexture. Portal vein is patent on color Doppler imaging with normal direction of blood flow towards the liver. Other: None. IMPRESSION: 1. No acute abnormality identified. 2. Increased echotexture of the liver. This is a nonspecific finding but can be seen in fatty infiltration of liver. Electronically Signed   By: Anna Barnes M.D.   On: 08/02/2023 09:43       Assessment & Plan:  Anxiety Assessment & Plan: Remains on prozac. Followed by psychiatry. Appears to be stable.    Change in bowel movement Assessment & Plan: Has had persistent intermittent GI issues. Constipation has been an issue. Taking linzess .  Does report that eating out can  aggravate GI issues. Unsure of other triggers.  Discussed possible food triggers/food allergies/allergies. Discussed referral to an allergist for further evaluation.   Orders: -     Ambulatory referral to Allergy  Chronic constipation Assessment & Plan: Saw GI.  Taking linzess  daily. S/p colonoscopy and EGD as outlined. Colonoscopy ok. EGD - gastritis.    Urinary retention Assessment & Plan: Saw urogyn 02/2023 - high tone pelvic floor dysfunction. Recommended PFPT. Has just started. Was given exercise to do at home.        Dellar Fenton, MD

## 2023-08-15 ENCOUNTER — Encounter: Payer: Self-pay | Admitting: Internal Medicine

## 2023-08-15 NOTE — Assessment & Plan Note (Signed)
 Saw GI.  Taking linzess  daily. S/p colonoscopy and EGD as outlined. Colonoscopy ok. EGD - gastritis.

## 2023-08-15 NOTE — Assessment & Plan Note (Signed)
 Has had persistent intermittent GI issues. Constipation has been an issue. Taking linzess .  Does report that eating out can aggravate GI issues. Unsure of other triggers.  Discussed possible food triggers/food allergies/allergies. Discussed referral to an allergist for further evaluation.

## 2023-08-15 NOTE — Assessment & Plan Note (Signed)
 Saw urogyn 02/2023 - high tone pelvic floor dysfunction. Recommended PFPT. Has just started. Was given exercise to do at home.

## 2023-08-15 NOTE — Assessment & Plan Note (Signed)
 Remains on prozac. Followed by psychiatry. Appears to be stable.

## 2023-08-20 DIAGNOSIS — F411 Generalized anxiety disorder: Secondary | ICD-10-CM | POA: Diagnosis not present

## 2023-08-20 DIAGNOSIS — F429 Obsessive-compulsive disorder, unspecified: Secondary | ICD-10-CM | POA: Diagnosis not present

## 2023-08-20 DIAGNOSIS — F325 Major depressive disorder, single episode, in full remission: Secondary | ICD-10-CM | POA: Diagnosis not present

## 2023-08-26 DIAGNOSIS — N3943 Post-void dribbling: Secondary | ICD-10-CM | POA: Diagnosis not present

## 2023-08-26 DIAGNOSIS — R3915 Urgency of urination: Secondary | ICD-10-CM | POA: Diagnosis not present

## 2023-08-26 DIAGNOSIS — J300 Vasomotor rhinitis: Secondary | ICD-10-CM | POA: Diagnosis not present

## 2023-08-26 DIAGNOSIS — M6289 Other specified disorders of muscle: Secondary | ICD-10-CM | POA: Diagnosis not present

## 2023-08-26 DIAGNOSIS — K589 Irritable bowel syndrome without diarrhea: Secondary | ICD-10-CM | POA: Diagnosis not present

## 2023-08-26 DIAGNOSIS — M62838 Other muscle spasm: Secondary | ICD-10-CM | POA: Diagnosis not present

## 2023-08-26 DIAGNOSIS — R1084 Generalized abdominal pain: Secondary | ICD-10-CM | POA: Diagnosis not present

## 2023-08-26 DIAGNOSIS — R3914 Feeling of incomplete bladder emptying: Secondary | ICD-10-CM | POA: Diagnosis not present

## 2023-08-26 DIAGNOSIS — K5909 Other constipation: Secondary | ICD-10-CM | POA: Diagnosis not present

## 2023-08-27 DIAGNOSIS — D2261 Melanocytic nevi of right upper limb, including shoulder: Secondary | ICD-10-CM | POA: Diagnosis not present

## 2023-08-27 DIAGNOSIS — D225 Melanocytic nevi of trunk: Secondary | ICD-10-CM | POA: Diagnosis not present

## 2023-08-27 DIAGNOSIS — D2239 Melanocytic nevi of other parts of face: Secondary | ICD-10-CM | POA: Diagnosis not present

## 2023-08-27 DIAGNOSIS — D2262 Melanocytic nevi of left upper limb, including shoulder: Secondary | ICD-10-CM | POA: Diagnosis not present

## 2023-08-27 DIAGNOSIS — L2089 Other atopic dermatitis: Secondary | ICD-10-CM | POA: Diagnosis not present

## 2023-09-02 DIAGNOSIS — F429 Obsessive-compulsive disorder, unspecified: Secondary | ICD-10-CM | POA: Diagnosis not present

## 2023-09-02 DIAGNOSIS — F330 Major depressive disorder, recurrent, mild: Secondary | ICD-10-CM | POA: Diagnosis not present

## 2023-09-09 DIAGNOSIS — N3943 Post-void dribbling: Secondary | ICD-10-CM | POA: Diagnosis not present

## 2023-09-09 DIAGNOSIS — R1084 Generalized abdominal pain: Secondary | ICD-10-CM | POA: Diagnosis not present

## 2023-09-09 DIAGNOSIS — K5909 Other constipation: Secondary | ICD-10-CM | POA: Diagnosis not present

## 2023-09-09 DIAGNOSIS — R3914 Feeling of incomplete bladder emptying: Secondary | ICD-10-CM | POA: Diagnosis not present

## 2023-09-09 DIAGNOSIS — M6289 Other specified disorders of muscle: Secondary | ICD-10-CM | POA: Diagnosis not present

## 2023-09-09 DIAGNOSIS — R3915 Urgency of urination: Secondary | ICD-10-CM | POA: Diagnosis not present

## 2023-09-09 DIAGNOSIS — M62838 Other muscle spasm: Secondary | ICD-10-CM | POA: Diagnosis not present

## 2023-09-16 DIAGNOSIS — F429 Obsessive-compulsive disorder, unspecified: Secondary | ICD-10-CM | POA: Diagnosis not present

## 2023-09-16 DIAGNOSIS — F330 Major depressive disorder, recurrent, mild: Secondary | ICD-10-CM | POA: Diagnosis not present

## 2023-09-28 DIAGNOSIS — F429 Obsessive-compulsive disorder, unspecified: Secondary | ICD-10-CM | POA: Diagnosis not present

## 2023-09-28 DIAGNOSIS — F330 Major depressive disorder, recurrent, mild: Secondary | ICD-10-CM | POA: Diagnosis not present

## 2023-10-25 DIAGNOSIS — F330 Major depressive disorder, recurrent, mild: Secondary | ICD-10-CM | POA: Diagnosis not present

## 2023-10-25 DIAGNOSIS — F429 Obsessive-compulsive disorder, unspecified: Secondary | ICD-10-CM | POA: Diagnosis not present

## 2023-10-29 DIAGNOSIS — R3914 Feeling of incomplete bladder emptying: Secondary | ICD-10-CM | POA: Diagnosis not present

## 2023-10-29 DIAGNOSIS — R1084 Generalized abdominal pain: Secondary | ICD-10-CM | POA: Diagnosis not present

## 2023-10-29 DIAGNOSIS — R3915 Urgency of urination: Secondary | ICD-10-CM | POA: Diagnosis not present

## 2023-10-29 DIAGNOSIS — K5909 Other constipation: Secondary | ICD-10-CM | POA: Diagnosis not present

## 2023-10-29 DIAGNOSIS — N3943 Post-void dribbling: Secondary | ICD-10-CM | POA: Diagnosis not present

## 2023-10-29 DIAGNOSIS — M62838 Other muscle spasm: Secondary | ICD-10-CM | POA: Diagnosis not present

## 2023-10-29 DIAGNOSIS — M6289 Other specified disorders of muscle: Secondary | ICD-10-CM | POA: Diagnosis not present

## 2023-11-09 DIAGNOSIS — F429 Obsessive-compulsive disorder, unspecified: Secondary | ICD-10-CM | POA: Diagnosis not present

## 2023-11-09 DIAGNOSIS — F330 Major depressive disorder, recurrent, mild: Secondary | ICD-10-CM | POA: Diagnosis not present

## 2023-11-12 DIAGNOSIS — K5909 Other constipation: Secondary | ICD-10-CM | POA: Diagnosis not present

## 2023-11-12 DIAGNOSIS — R1084 Generalized abdominal pain: Secondary | ICD-10-CM | POA: Diagnosis not present

## 2023-11-12 DIAGNOSIS — R3915 Urgency of urination: Secondary | ICD-10-CM | POA: Diagnosis not present

## 2023-11-12 DIAGNOSIS — R3914 Feeling of incomplete bladder emptying: Secondary | ICD-10-CM | POA: Diagnosis not present

## 2023-11-12 DIAGNOSIS — M62838 Other muscle spasm: Secondary | ICD-10-CM | POA: Diagnosis not present

## 2023-11-12 DIAGNOSIS — N3943 Post-void dribbling: Secondary | ICD-10-CM | POA: Diagnosis not present

## 2023-11-12 DIAGNOSIS — M6289 Other specified disorders of muscle: Secondary | ICD-10-CM | POA: Diagnosis not present

## 2023-11-19 DIAGNOSIS — F411 Generalized anxiety disorder: Secondary | ICD-10-CM | POA: Diagnosis not present

## 2023-11-19 DIAGNOSIS — F429 Obsessive-compulsive disorder, unspecified: Secondary | ICD-10-CM | POA: Diagnosis not present

## 2023-11-19 DIAGNOSIS — F325 Major depressive disorder, single episode, in full remission: Secondary | ICD-10-CM | POA: Diagnosis not present

## 2024-01-20 DIAGNOSIS — R3914 Feeling of incomplete bladder emptying: Secondary | ICD-10-CM | POA: Diagnosis not present

## 2024-01-20 DIAGNOSIS — R3915 Urgency of urination: Secondary | ICD-10-CM | POA: Diagnosis not present

## 2024-01-20 DIAGNOSIS — M62838 Other muscle spasm: Secondary | ICD-10-CM | POA: Diagnosis not present

## 2024-01-20 DIAGNOSIS — N3943 Post-void dribbling: Secondary | ICD-10-CM | POA: Diagnosis not present

## 2024-01-20 DIAGNOSIS — M6289 Other specified disorders of muscle: Secondary | ICD-10-CM | POA: Diagnosis not present

## 2024-01-20 DIAGNOSIS — K5909 Other constipation: Secondary | ICD-10-CM | POA: Diagnosis not present

## 2024-01-20 DIAGNOSIS — R1084 Generalized abdominal pain: Secondary | ICD-10-CM | POA: Diagnosis not present

## 2024-02-14 ENCOUNTER — Ambulatory Visit (INDEPENDENT_AMBULATORY_CARE_PROVIDER_SITE_OTHER): Admitting: Internal Medicine

## 2024-02-14 ENCOUNTER — Encounter: Payer: Self-pay | Admitting: Internal Medicine

## 2024-02-14 ENCOUNTER — Other Ambulatory Visit (HOSPITAL_COMMUNITY)
Admission: RE | Admit: 2024-02-14 | Discharge: 2024-02-14 | Disposition: A | Discharge status: Home / Self Care | Source: Ambulatory Visit | Attending: Internal Medicine | Admitting: Internal Medicine

## 2024-02-14 VITALS — BP 110/70 | HR 80 | Temp 97.9°F | Ht 64.0 in | Wt 210.6 lb

## 2024-02-14 DIAGNOSIS — Z0000 Encounter for general adult medical examination without abnormal findings: Secondary | ICD-10-CM | POA: Diagnosis not present

## 2024-02-14 DIAGNOSIS — R198 Other specified symptoms and signs involving the digestive system and abdomen: Secondary | ICD-10-CM

## 2024-02-14 DIAGNOSIS — K5909 Other constipation: Secondary | ICD-10-CM

## 2024-02-14 DIAGNOSIS — Z13220 Encounter for screening for lipoid disorders: Secondary | ICD-10-CM

## 2024-02-14 DIAGNOSIS — Z124 Encounter for screening for malignant neoplasm of cervix: Secondary | ICD-10-CM | POA: Insufficient documentation

## 2024-02-14 DIAGNOSIS — F419 Anxiety disorder, unspecified: Secondary | ICD-10-CM | POA: Diagnosis not present

## 2024-02-14 DIAGNOSIS — R339 Retention of urine, unspecified: Secondary | ICD-10-CM

## 2024-02-14 NOTE — Progress Notes (Signed)
 Subjective:    Patient ID: Diana [name], female    DOB: Mar 11, 2001, 23 y.o.   MRN: [id number]  Patient here for  Chief Complaint  Patient presents with   Annual Exam    HPI Here for a physical exam. Saw urogyn 02/2023 - high tone pelvic floor dysfunction. Recommended PFPT. Has been going to PFPT. Completed.  Also seeing GI for persistent bowel issues. Is S/p colonoscopy 07/21/23- normal.  EGD 07/21/23 - gastritis. She is taking linzess  - every morning. Is being followed by psychiatry. On prozac 60mg  q day now. Doing well on this dose. Is planning to complete her undergrad in December. Contemplating grad school. Breathing stable. No allergy symptoms. Current dose of linzess  - working for her. She did see an allergist. Testing unrevealing. Bowels stable.    Past Medical History:  Diagnosis Date   Abdominal pain    Anxiety    Phreesia 10/17/2019   Constipation    Depression    Dyspepsia    GERD (gastroesophageal reflux disease)    IBS (irritable bowel syndrome)    Reflux    Viral meningitis 05/05/01   Past Surgical History:  Procedure Laterality Date   COLONOSCOPY WITH PROPOFOL  N/A 07/21/2023   Procedure: COLONOSCOPY WITH PROPOFOL ;  Surgeon: Toledo, Ladell POUR, MD;  Location: [facility] END[facility]COPY;  Service: Gastroenterology;  Laterality: N/A;   ESOPHAGOGASTRODUODENOSCOPY (EGD) WITH PROPOFOL  N/A 07/21/2023   Procedure: ESOPHAGOGASTRODUODENOSCOPY (EGD) WITH PROPOFOL ;  Surgeon: Toledo, Ladell POUR, MD;  Location: [facility] END[facility]COPY;  Service: Gastroenterology;  Laterality: N/A;   WISDOM TOOTH EXTRACTION     Family History  Problem Relation Age of Onset   Arthritis Brother    Asthma Brother    Rheum arthritis Brother    Endometrial cancer Mother    Anxiety disorder Mother    Hearing loss Father    Hypertension Father    Kidney Stones Father    Breast cancer Maternal Grandmother    Alzheimer's disease Maternal Grandmother    Hypertension Maternal Grandmother    Early death Maternal  Grandfather    Macular degeneration Paternal Grandmother    Hypertension Paternal Grandmother    Arthritis Paternal Grandmother    Kidney Stones Paternal Grandmother    Heart disease Paternal Grandfather    Kidney disease Paternal Grandfather    Hirschsprung's disease Neg Hx    GER disease Neg Hx    Social History   Socioeconomic History   Marital status: Single    Spouse name: Not on file   Number of children: Not on file   Years of education: Not on file   Highest education level: Not on file  Occupational History   Not on file  Tobacco Use   Smoking status: Never    Passive exposure: Never   Smokeless tobacco: Never  Vaping Use   Vaping status: Never Used  Substance and Sexual Activity   Alcohol use: Not Currently   Drug use: Never   Sexual activity: Never  Other Topics Concern   Not on file  Social History Narrative   Lives with with mom, dad, and her brother Marinell   She will start 12th grade in the fall at [employer].    Social Drivers of Corporate Investment Banker Strain: Not on file  Food Insecurity: Not on file  Transportation Needs: Not on file  Physical Activity: Not on file  Stress: Not on file  Social Connections: Not on file     Review of Systems  Constitutional:  Negative for appetite change and unexpected weight change.  HENT:  Negative for congestion, sinus pressure and sore throat.   Eyes:  Negative for pain and visual disturbance.  Respiratory:  Negative for cough, chest tightness and shortness of breath.   Cardiovascular:  Negative for chest pain, palpitations and leg swelling.  Gastrointestinal:  Negative for diarrhea, nausea and vomiting.       Bowels stable.   Genitourinary:  Negative for difficulty urinating and dysuria.  Musculoskeletal:  Negative for joint swelling and myalgias.  Skin:  Negative for color change and rash.  Neurological:  Negative for dizziness and headaches.  Hematological:  Negative for adenopathy.  Does not bruise/bleed easily.  Psychiatric/Behavioral:  Negative for agitation and dysphoric mood.        Objective:     BP 110/70   Pulse 80   Temp 97.9 F (36.6 C) (Oral)   Ht 5' 4 (1.626 m)   Wt 210 lb 9.6 oz (95.5 kg)   LMP 02/13/2024 (Exact Date)   SpO2 99%   Breastfeeding No   BMI 36.15 kg/m  Wt Readings from Last 3 Encounters:  02/14/24 210 lb 9.6 oz (95.5 kg)  08/13/23 210 lb (95.3 kg)  02/12/23 213 lb 9.6 oz (96.9 kg)    Physical Exam Vitals reviewed.  Constitutional:      General: She is not in acute distress.    Appearance: Normal appearance. She is well-developed.  HENT:     Head: Normocephalic and atraumatic.     Right Ear: External ear normal.     Left Ear: External ear normal.     Mouth/Throat:     Pharynx: No oropharyngeal exudate or posterior oropharyngeal erythema.  Eyes:     General: No scleral icterus.       Right eye: No discharge.        Left eye: No discharge.     Conjunctiva/sclera: Conjunctivae normal.  Neck:     Thyroid: No thyromegaly.  Cardiovascular:     Rate and Rhythm: Normal rate and regular rhythm.  Pulmonary:     Effort: No tachypnea, accessory muscle usage or respiratory distress.     Breath sounds: Normal breath sounds. No decreased breath sounds, wheezing or rhonchi.  Chest:  Breasts:    Right: No inverted nipple, mass, nipple discharge or tenderness (no axillary adenopathy).     Left: No inverted nipple, mass, nipple discharge or tenderness (no axilarry adenopathy).  Abdominal:     General: Bowel sounds are normal.     Palpations: Abdomen is soft.     Tenderness: There is no abdominal tenderness.  Genitourinary:    Comments: Normal external genitalia.  Vaginal vault without lesions.  Cervix identified.  Pap smear performed.  Could not appreciate any adnexal masses or tenderness.   Musculoskeletal:        General: No swelling or tenderness.     Cervical back: Neck supple.  Lymphadenopathy:     Cervical: No cervical  adenopathy.  Skin:    General: Skin is warm.     Findings: No erythema or rash.  Neurological:     Mental Status: She is alert and oriented to person, place, and time.  Psychiatric:        Mood and Affect: Mood normal.        Behavior: Behavior normal.         Outpatient Encounter Medications as of 02/14/2024  Medication Sig   FLUoxetine (PROZAC) 20 MG capsule Take 60 mg by mouth  every morning.   linaclotide  (LINZESS ) 290 MCG CAPS capsule Take 1 capsule (290 mcg total) by mouth daily. (Patient taking differently: Take 145 mcg by mouth daily.)   triamcinolone  cream (KENALOG ) 0.1 % Apply topically 2 (two) times daily as needed.   No facility-administered encounter medications on file as of 02/14/2024.     Lab Results  Component Value Date   WBC 7.4 02/19/2023   HGB 13.1 02/19/2023   HCT 39.5 02/19/2023   PLT 380.0 02/19/2023   GLUCOSE 92 02/19/2023   CHOL 140 02/19/2023   TRIG 70.0 02/19/2023   HDL 47.00 02/19/2023   LDLCALC 79 02/19/2023   ALT 13 02/19/2023   AST 14 02/19/2023   NA 138 02/19/2023   K 4.1 02/19/2023   CL 105 02/19/2023   CREATININE 0.77 02/19/2023   BUN 7 02/19/2023   CO2 26 02/19/2023   TSH 1.35 02/19/2023    US  Abdomen Limited RUQ (LIVER/GB) Result Date: 08/02/2023 CLINICAL DATA:  Abdomen pain. EXAM: ULTRASOUND ABDOMEN LIMITED RIGHT UPPER QUADRANT COMPARISON:  None Available. FINDINGS: Gallbladder: No gallstones or wall thickening visualized. No sonographic Murphy sign noted by sonographer. Common bile duct: Diameter: 3.1 mm. Liver: No focal lesion. Increased echotexture. Portal vein is patent on color Doppler imaging with normal direction of blood flow towards the liver. Other: None. IMPRESSION: 1. No acute abnormality identified. 2. Increased echotexture of the liver. This is a nonspecific finding but can be seen in fatty infiltration of liver. Electronically Signed   By: Craig Farr M.D.   On: 08/02/2023 09:43       Assessment & Plan:  Routine  general medical examination at a health care facility  Screening cholesterol level  Change in bowel movement  Healthcare maintenance Assessment & Plan: Physical today 02/15/24.  PAP today.    Cervical cancer screening -     Cytology - PAP  Urinary retention Assessment & Plan: Saw urogyn 02/2023 - high tone pelvic floor dysfunction. Recommended PFPT. Has completed.  Was given exercise to do at home.  She is doing exercise. Follow.    Chronic constipation Assessment & Plan: Saw GI.  Taking linzess  daily. S/p colonoscopy and EGD. Colonoscopy ok. EGD - gastritis. Doing well on her current dose of linzess .    Anxiety Assessment & Plan: Seeing Dr Chipper. On prozac 60mg  q day now. Doing well on current medication. Follow.       Allena Hamilton, MD

## 2024-02-14 NOTE — Assessment & Plan Note (Signed)
 Saw urogyn 02/2023 - high tone pelvic floor dysfunction. Recommended PFPT. Has completed.  Was given exercise to do at home.  She is doing exercise. Follow.

## 2024-02-14 NOTE — Assessment & Plan Note (Signed)
 Physical today 02/15/24.  PAP today.

## 2024-02-14 NOTE — Assessment & Plan Note (Signed)
 Seeing Dr Chipper. On prozac 60mg  q day now. Doing well on current medication. Follow.

## 2024-02-14 NOTE — Assessment & Plan Note (Signed)
 Saw GI.  Taking linzess  daily. S/p colonoscopy and EGD. Colonoscopy ok. EGD - gastritis. Doing well on her current dose of linzess .

## 2024-02-15 LAB — CYTOLOGY - PAP: Diagnosis: NEGATIVE

## 2024-02-16 ENCOUNTER — Ambulatory Visit: Payer: Self-pay | Admitting: Internal Medicine

## 2024-02-18 DIAGNOSIS — F411 Generalized anxiety disorder: Secondary | ICD-10-CM | POA: Diagnosis not present

## 2024-02-18 DIAGNOSIS — F325 Major depressive disorder, single episode, in full remission: Secondary | ICD-10-CM | POA: Diagnosis not present

## 2024-02-18 DIAGNOSIS — F429 Obsessive-compulsive disorder, unspecified: Secondary | ICD-10-CM | POA: Diagnosis not present

## 2024-04-02 DIAGNOSIS — J029 Acute pharyngitis, unspecified: Secondary | ICD-10-CM | POA: Diagnosis not present

## 2024-04-02 DIAGNOSIS — Z03818 Encounter for observation for suspected exposure to other biological agents ruled out: Secondary | ICD-10-CM | POA: Diagnosis not present

## 2024-04-02 DIAGNOSIS — J101 Influenza due to other identified influenza virus with other respiratory manifestations: Secondary | ICD-10-CM | POA: Diagnosis not present

## 2025-02-14 ENCOUNTER — Encounter: Admitting: Internal Medicine
